# Patient Record
Sex: Male | Born: 1965 | Race: White | Hispanic: No | Marital: Single | Smoking: Former smoker
Health system: Southern US, Community
[De-identification: ages and names within clinical notes are randomized; demographics above are authoritative.]

## PROBLEM LIST (undated history)

## (undated) DIAGNOSIS — B009 Herpesviral infection, unspecified: Secondary | ICD-10-CM

## (undated) DIAGNOSIS — K5792 Diverticulitis of intestine, part unspecified, without perforation or abscess without bleeding: Secondary | ICD-10-CM

## (undated) HISTORY — PX: VASECTOMY: SHX75

## (undated) HISTORY — PX: WISDOM TOOTH EXTRACTION: SHX21

---

## 2015-05-05 ENCOUNTER — Emergency Department (HOSPITAL_COMMUNITY)
Admission: EM | Admit: 2015-05-05 | Discharge: 2015-05-05 | Disposition: A | Discharge status: Home / Self Care | Payer: BLUE CROSS/BLUE SHIELD | Attending: Emergency Medicine | Admitting: Emergency Medicine

## 2015-05-05 ENCOUNTER — Encounter (HOSPITAL_COMMUNITY): Payer: Self-pay | Admitting: *Deleted

## 2015-05-05 ENCOUNTER — Emergency Department (HOSPITAL_COMMUNITY): Payer: BLUE CROSS/BLUE SHIELD

## 2015-05-05 DIAGNOSIS — R002 Palpitations: Secondary | ICD-10-CM | POA: Diagnosis not present

## 2015-05-05 DIAGNOSIS — F17200 Nicotine dependence, unspecified, uncomplicated: Secondary | ICD-10-CM | POA: Diagnosis not present

## 2015-05-05 DIAGNOSIS — R079 Chest pain, unspecified: Secondary | ICD-10-CM | POA: Diagnosis present

## 2015-05-05 DIAGNOSIS — R0781 Pleurodynia: Secondary | ICD-10-CM | POA: Diagnosis not present

## 2015-05-05 DIAGNOSIS — R5383 Other fatigue: Secondary | ICD-10-CM | POA: Diagnosis not present

## 2015-05-05 LAB — CBC
HCT: 42.6 % (ref 39.0–52.0)
HEMOGLOBIN: 15.1 g/dL (ref 13.0–17.0)
MCH: 31.5 pg (ref 26.0–34.0)
MCHC: 35.4 g/dL (ref 30.0–36.0)
MCV: 88.8 fL (ref 78.0–100.0)
PLATELETS: 200 10*3/uL (ref 150–400)
RBC: 4.8 MIL/uL (ref 4.22–5.81)
RDW: 12 % (ref 11.5–15.5)
WBC: 7 10*3/uL (ref 4.0–10.5)

## 2015-05-05 LAB — BASIC METABOLIC PANEL
Anion gap: 8 (ref 5–15)
BUN: 16 mg/dL (ref 6–20)
CHLORIDE: 106 mmol/L (ref 101–111)
CO2: 26 mmol/L (ref 22–32)
CREATININE: 0.94 mg/dL (ref 0.61–1.24)
Calcium: 9.5 mg/dL (ref 8.9–10.3)
GFR calc non Af Amer: >60 mL/min (ref >60)
GLUCOSE: 67 mg/dL (ref 65–99)
Potassium: 3.9 mmol/L (ref 3.5–5.1)
Sodium: 140 mmol/L (ref 135–145)

## 2015-05-05 LAB — I-STAT TROPONIN, ED
TROPONIN I, POC: 0 ng/mL (ref 0.00–0.08)
Troponin i, poc: 0 ng/mL (ref 0.00–0.08)

## 2015-05-05 LAB — LIPASE, BLOOD: Lipase: 34 U/L (ref 11–51)

## 2015-05-05 MED ORDER — NAPROXEN 500 MG PO TABS: 500.0000 mg | ORAL_TABLET | Freq: Two times a day (BID) | ORAL | Status: DC

## 2015-05-05 MED ORDER — METHOCARBAMOL 500 MG PO TABS: 500.0000 mg | ORAL_TABLET | Freq: Two times a day (BID) | ORAL | Status: DC | PRN

## 2015-05-05 NOTE — ED Provider Notes (Signed)
CSN: [id number]     Arrival date & time 05/05/15  1151 History   First MD Initiated Contact with Patient 05/05/15 1217     Chief Complaint  Patient presents with  . Chest Pain     (Consider location/radiation/quality/duration/timing/severity/associated sxs/prior Treatment) HPI Comments: Jerome Burke is a 50 year old male, current smoker, otherwise healthy presents to the emergency department with left lower chest and rib pain 1 day, which is described as sharp and uncomfortable, worsened with deep inspiration or with arching his back, relieved with rest, without any associated symptoms. His chest pain does not worsen with exertion. He denies cough, wheeze, shortness of breath, fever, chills, sweats.   He also concerned with palpitations described as "a flutter" that have been occurring more frequently and for longer over the past 3 weeks. He states that for several years he has had brief palpitations the last 1-2 seconds, and became much worse 3 weeks ago after drinking heavily one day and after recent URI.  Patient denies any associated diaphoresis, chest pain, shortness of breath, near syncope, nausea, vomiting. He denies any lower extremity edema or weight gain. He has mild fatigue which he believes is related to his recent URI. He denies any history of hypertension, hyperlipidemia, diabetes. He is otherwise healthy, exercises normally.  No family history of MI or sharp. He does have a brother with A. Fib.  Smoking history estimated to be 20-pack-year.  Patient is a 50 y.o. male presenting with chest pain and palpitations. The history is provided by the patient.  Chest Pain Pain location:  L lateral chest Pain quality: sharp   Pain radiates to:  Does not radiate Pain radiates to the back: no   Pain severity:  Mild Onset quality:  Sudden Duration:  1 day Timing:  Intermittent Progression:  Unchanged Chronicity:  New Context: breathing and movement   Context: no drug use, not  eating, no intercourse, not lifting, not raising an arm, not at rest, no stress and no trauma   Relieved by:  Rest Worsened by:  Deep breathing Associated symptoms: fatigue and palpitations   Associated symptoms: no abdominal pain, no altered mental status, no anxiety, no back pain, no claudication, no cough, no diaphoresis, no dizziness, no fever, no headache, no heartburn, no lower extremity edema, no nausea, no near-syncope, no numbness, no orthopnea, no PND, no shortness of breath, no syncope, not vomiting and no weakness   Associated symptoms comment:  Palpitation lasting up to 3 hours for about 3 weeks URI 2 weeks ago Fatigue:    Severity:  Mild   Duration:  2 weeks   Timing:  Intermittent (after recent URI 2 weeks ago)   Progression:  Improving Risk factors: smoking (35 years smoking hx)   Risk factors: no diabetes mellitus, no high cholesterol, no hypertension and not obese   Palpitations Palpitations quality:  Irregular Onset quality:  Sudden Duration:  3 hours Timing:  Intermittent Progression:  Worsening Chronicity:  Recurrent Context: nicotine   Context: not anxiety, not appetite suppressants, not blood loss, not bronchodilators, not caffeine, not dehydration, not exercise, not hyperventilation, not illicit drugs and not stimulant use   Relieved by:  Nothing Worsened by:  Nothing Ineffective treatments:  None tried Associated symptoms: chest pain   Associated symptoms: no back pain, no chest pressure, no cough, no diaphoresis, no dizziness, no hemoptysis, no leg pain, no lower extremity edema, no nausea, no near-syncope, no numbness, no orthopnea, no PND, no shortness of breath, no syncope, no vomiting  and no weakness     History reviewed. No pertinent past medical history. History reviewed. No pertinent past surgical history. No family history on file. Social History  Substance Use Topics  . Smoking status: Current Every Day Smoker  . Smokeless tobacco: None  .  Alcohol Use: Yes     Comment: occ    Review of Systems  Constitutional: Positive for fatigue. Negative for fever and diaphoresis.  Respiratory: Negative for cough, hemoptysis and shortness of breath.   Cardiovascular: Positive for chest pain and palpitations. Negative for orthopnea, claudication, syncope, PND and near-syncope.  Gastrointestinal: Negative for heartburn, nausea, vomiting and abdominal pain.  Musculoskeletal: Negative for back pain.  Neurological: Negative for dizziness, weakness, numbness and headaches.  All other systems reviewed and are negative.     Allergies  Review of patient's allergies indicates no known allergies.  Home Medications   Prior to Admission medications   Medication Sig Start Date End Date Taking? Authorizing Provider  ibuprofen (ADVIL,MOTRIN) 200 MG tablet Take 400 mg by mouth every 6 (six) hours as needed for mild pain.   Yes Historical Provider, MD  methocarbamol (ROBAXIN) 500 MG tablet Take 1 tablet (500 mg total) by mouth 2 (two) times daily as needed for muscle spasms. 05/05/15   Danelle Berry, PA-C  naproxen (NAPROSYN) 500 MG tablet Take 1 tablet (500 mg total) by mouth 2 (two) times daily with a meal. 05/05/15   Danelle Berry, PA-C  simethicone (MYLICON) 80 MG chewable tablet Chew 80 mg by mouth every 6 (six) hours as needed for flatulence.   Yes Historical Provider, MD   BP 112/76 mmHg  Pulse 62  Temp(Src) 98 F (36.7 C) (Oral)  Resp 16  SpO2 99% Physical Exam  Constitutional: He is oriented to person, place, and time. He appears well-developed and well-nourished. No distress.  HENT:  Head: Normocephalic and atraumatic.  Nose: Nose normal.  Mouth/Throat: Oropharynx is clear and moist. No oropharyngeal exudate.  Eyes: Conjunctivae and EOM are normal. Pupils are equal, round, and reactive to light. Right eye exhibits no discharge. Left eye exhibits no discharge. No scleral icterus.  Neck: Normal range of motion. No JVD present. No tracheal  deviation present. No thyromegaly present.  Cardiovascular: Normal rate, regular rhythm, normal heart sounds and intact distal pulses.   Occasional extrasystoles are present. Exam reveals no gallop and no friction rub.   No murmur heard. Pulses:      Radial pulses are 2+ on the right side, and 2+ on the left side.       Dorsalis pedis pulses are 2+ on the right side, and 2+ on the left side.  No LE edema  Pulmonary/Chest: Effort normal and breath sounds normal. No respiratory distress. He has no wheezes. He has no rales. He exhibits no tenderness.  Abdominal: Soft. Bowel sounds are normal. He exhibits no distension and no mass. There is no tenderness. There is no rebound and no guarding.  Musculoskeletal: Normal range of motion. He exhibits no edema or tenderness.  Lymphadenopathy:    He has no cervical adenopathy.  Neurological: He is alert and oriented to person, place, and time. He has normal reflexes. No cranial nerve deficit. He exhibits normal muscle tone. Coordination normal.  Skin: Skin is warm and dry. No rash noted. He is not diaphoretic. No erythema. No pallor.  Psychiatric: He has a normal mood and affect. His behavior is normal. Judgment and thought content normal.  Nursing note and vitals reviewed.   ED  Course  Procedures (including critical care time) Labs Review Labs Reviewed  BASIC METABOLIC PANEL  CBC  LIPASE, BLOOD  I-STAT TROPOININ, ED    Imaging Review Dg Chest 2 View  05/05/2015  CLINICAL DATA:  50 year old male with heart palpitations and chest pressure EXAM: CHEST  2 VIEW COMPARISON:  None. FINDINGS: The lungs are clear and negative for focal airspace consolidation, pulmonary edema or suspicious pulmonary nodule. No pleural effusion or pneumothorax. Cardiac and mediastinal contours are within normal limits. No acute fracture or lytic or blastic osseous lesions. The visualized upper abdominal bowel gas pattern is unremarkable. IMPRESSION: No active cardiopulmonary  disease. Electronically Signed   By: Malachy Moan M.D.   On: 05/05/2015 13:15   I have personally reviewed and evaluated these images and lab results as part of my medical decision-making.   EKG Interpretation   Date/Time:  Saturday May 05 2015 11:56:18 EDT Ventricular Rate:  68 PR Interval:  146 QRS Duration: 88 QT Interval:  366 QTC Calculation: 389 R Axis:   87 Text Interpretation:  Normal sinus rhythm Right atrial enlargement Septal  infarct , age undetermined Abnormal ECG Confirmed by RAY MD, Duwayne Heck  [phone]) on 05/05/2015 2:07:40 PM      MDM   50 y/o male, current smoker, otherwise healthy, presents to the ER with left lower rib and chest pain, onset one day ago, pt believes he "slept wrong."  At rest patient has no chest pain.  Chest pain is not in increased or brought on by exertion, no occurrence at rest, not concerning for ACS.  Chest pain often obtaining for PE, PERC negative.  Also troponin will be obtained at 5 PM, expect patient to be a disposition at that time with a negative delta troponin.   Patient has been observed on monitoring in the ER for several hours, he has occasional PVCs without any other associated symptoms other than feeling the palpitations.  Suggest cardiology follow up, referral given on d/c paperwork.  Delta trop pending, hand off given to [employer], PA-C at shift change.  Will D/C with negative trop.   Final diagnoses:  Palpitations  Pleuritic chest pain       Danelle Berry, PA-C 05/09/15 1012  Margarita Grizzle, MD 05/14/15 1245

## 2015-05-05 NOTE — Discharge Instructions (Signed)
Palpitations A palpitation is the feeling that your heartbeat is irregular or is faster than normal. It may feel like your heart is fluttering or skipping a beat. Palpitations are usually not a serious problem. However, in some cases, you may need further medical evaluation. CAUSES  Palpitations can be caused by:  Smoking.  Caffeine or other stimulants, such as diet pills or energy drinks.  Alcohol.  Stress and anxiety.  Strenuous physical activity.  Fatigue.  Certain medicines.  Heart disease, especially if you have a history of irregular heart rhythms (arrhythmias), such as atrial fibrillation, atrial flutter, or supraventricular tachycardia.  An improperly working pacemaker or defibrillator. DIAGNOSIS  To find the cause of your palpitations, your health care provider will take your medical history and perform a physical exam. Your health care provider may also have you take a test called an ambulatory electrocardiogram (ECG). An ECG records your heartbeat patterns over a 24-hour period. You may also have other tests, such as:  Transthoracic echocardiogram (TTE). During echocardiography, sound waves are used to evaluate how blood flows through your heart.  Transesophageal echocardiogram (TEE).  Cardiac monitoring. This allows your health care provider to monitor your heart rate and rhythm in real time.  Holter monitor. This is a portable device that records your heartbeat and can help diagnose heart arrhythmias. It allows your health care provider to track your heart activity for several days, if needed.  Stress tests by exercise or by giving medicine that makes the heart beat faster. TREATMENT  Treatment of palpitations depends on the cause of your symptoms and can vary greatly. Most cases of palpitations do not require any treatment other than time, relaxation, and monitoring your symptoms. Other causes, such as atrial fibrillation, atrial flutter, or supraventricular  tachycardia, usually require further treatment. HOME CARE INSTRUCTIONS   Avoid:  Caffeinated coffee, tea, soft drinks, diet pills, and energy drinks.  Chocolate.  Alcohol.  Stop smoking if you smoke.  Reduce your stress and anxiety. Things that can help you relax include:  A method of controlling things in your body, such as your heartbeats, with your mind (biofeedback).  Yoga.  Meditation.  Physical activity such as swimming, jogging, or walking.  Get plenty of rest and sleep. SEEK MEDICAL CARE IF:   You continue to have a fast or irregular heartbeat beyond 24 hours.  Your palpitations occur more often. SEEK IMMEDIATE MEDICAL CARE IF:  You have chest pain or shortness of breath.  You have a severe headache.  You feel dizzy or you faint. MAKE SURE YOU:  Understand these instructions.  Will watch your condition.  Will get help right away if you are not doing well or get worse.   This information is not intended to replace advice given to you by your health care provider. Make sure you discuss any questions you have with your health care provider.   Document Released: 01/04/2000 Document Revised: 01/11/2013 Document Reviewed: 03/07/2011 Elsevier Interactive Patient Education 2016 [employer]     Chest Wall Pain Chest wall pain is pain in or around the bones and muscles of your chest. Sometimes, an injury causes this pain. Sometimes, the cause may not be known. This pain may take several weeks or longer to get better. HOME CARE INSTRUCTIONS  Pay attention to any changes in your symptoms. Take these actions to help with your pain:   Rest as told by your health care provider.   Avoid activities that cause pain. These include any activities that use  your chest muscles or your abdominal and side muscles to lift heavy items.   If directed, apply ice to the painful area:  Put ice in a plastic bag.  Place a towel between your skin and the bag.  Leave the  ice on for 20 minutes, 2-3 times per day.  Take over-the-counter and prescription medicines only as told by your health care provider.  Do not use tobacco products, including cigarettes, chewing tobacco, and e-cigarettes. If you need help quitting, ask your health care provider.  Keep all follow-up visits as told by your health care provider. This is important. SEEK MEDICAL CARE IF:  You have a fever.  Your chest pain becomes worse.  You have new symptoms. SEEK IMMEDIATE MEDICAL CARE IF:  You have nausea or vomiting.  You feel sweaty or light-headed.  You have a cough with phlegm (sputum) or you cough up blood.  You develop shortness of breath.   This information is not intended to replace advice given to you by your health care provider. Make sure you discuss any questions you have with your health care provider.   Document Released: 01/06/2005 Document Revised: 09/27/2014 Document Reviewed: 04/03/2014 Elsevier Interactive Patient Education [employer]

## 2015-05-05 NOTE — ED Notes (Signed)
Pt ambulated without any assistance. Pt requested to jog in place in room. Pt jogged for 3 minutes. Initial HR was 88 and ending was 98. Pt placed back on monitor and NSR was seen with occasional PVCs. Pt denied any dizziness, SOB etc.

## 2015-05-05 NOTE — ED Provider Notes (Signed)
Patient signed out to me by Angelica Chessmanapia, PA-C.  Intermittent CP x 3 weeks.  No SOB.  Plan: DC pending normal delta trop.  Second troponin is negative. Symptoms seem musculoskeletal, the pain is reproducible with palpation and with movement. Doubt ACS. Recommend the/cardiology follow-up. Patient understands agrees the plan. He is stable and ready for discharge.  Roxy Horsemanobert Aron Inge, PA-C 05/05/15 1830  Margarita Grizzleanielle Ray, MD 05/08/15 1620

## 2015-05-05 NOTE — ED Notes (Signed)
Pt is here with left below chest/upper abdominal pain intermittent over the last three weeks. No sob, vomiting

## 2017-03-28 IMAGING — DX DG CHEST 2V
2 series · 2 of 2 positions shown · non-contrast
Comparison: None.

CLINICAL DATA: 50-year-old male with heart palpitations and chest
pressure

EXAM:
CHEST  2 VIEW

[chest pa]
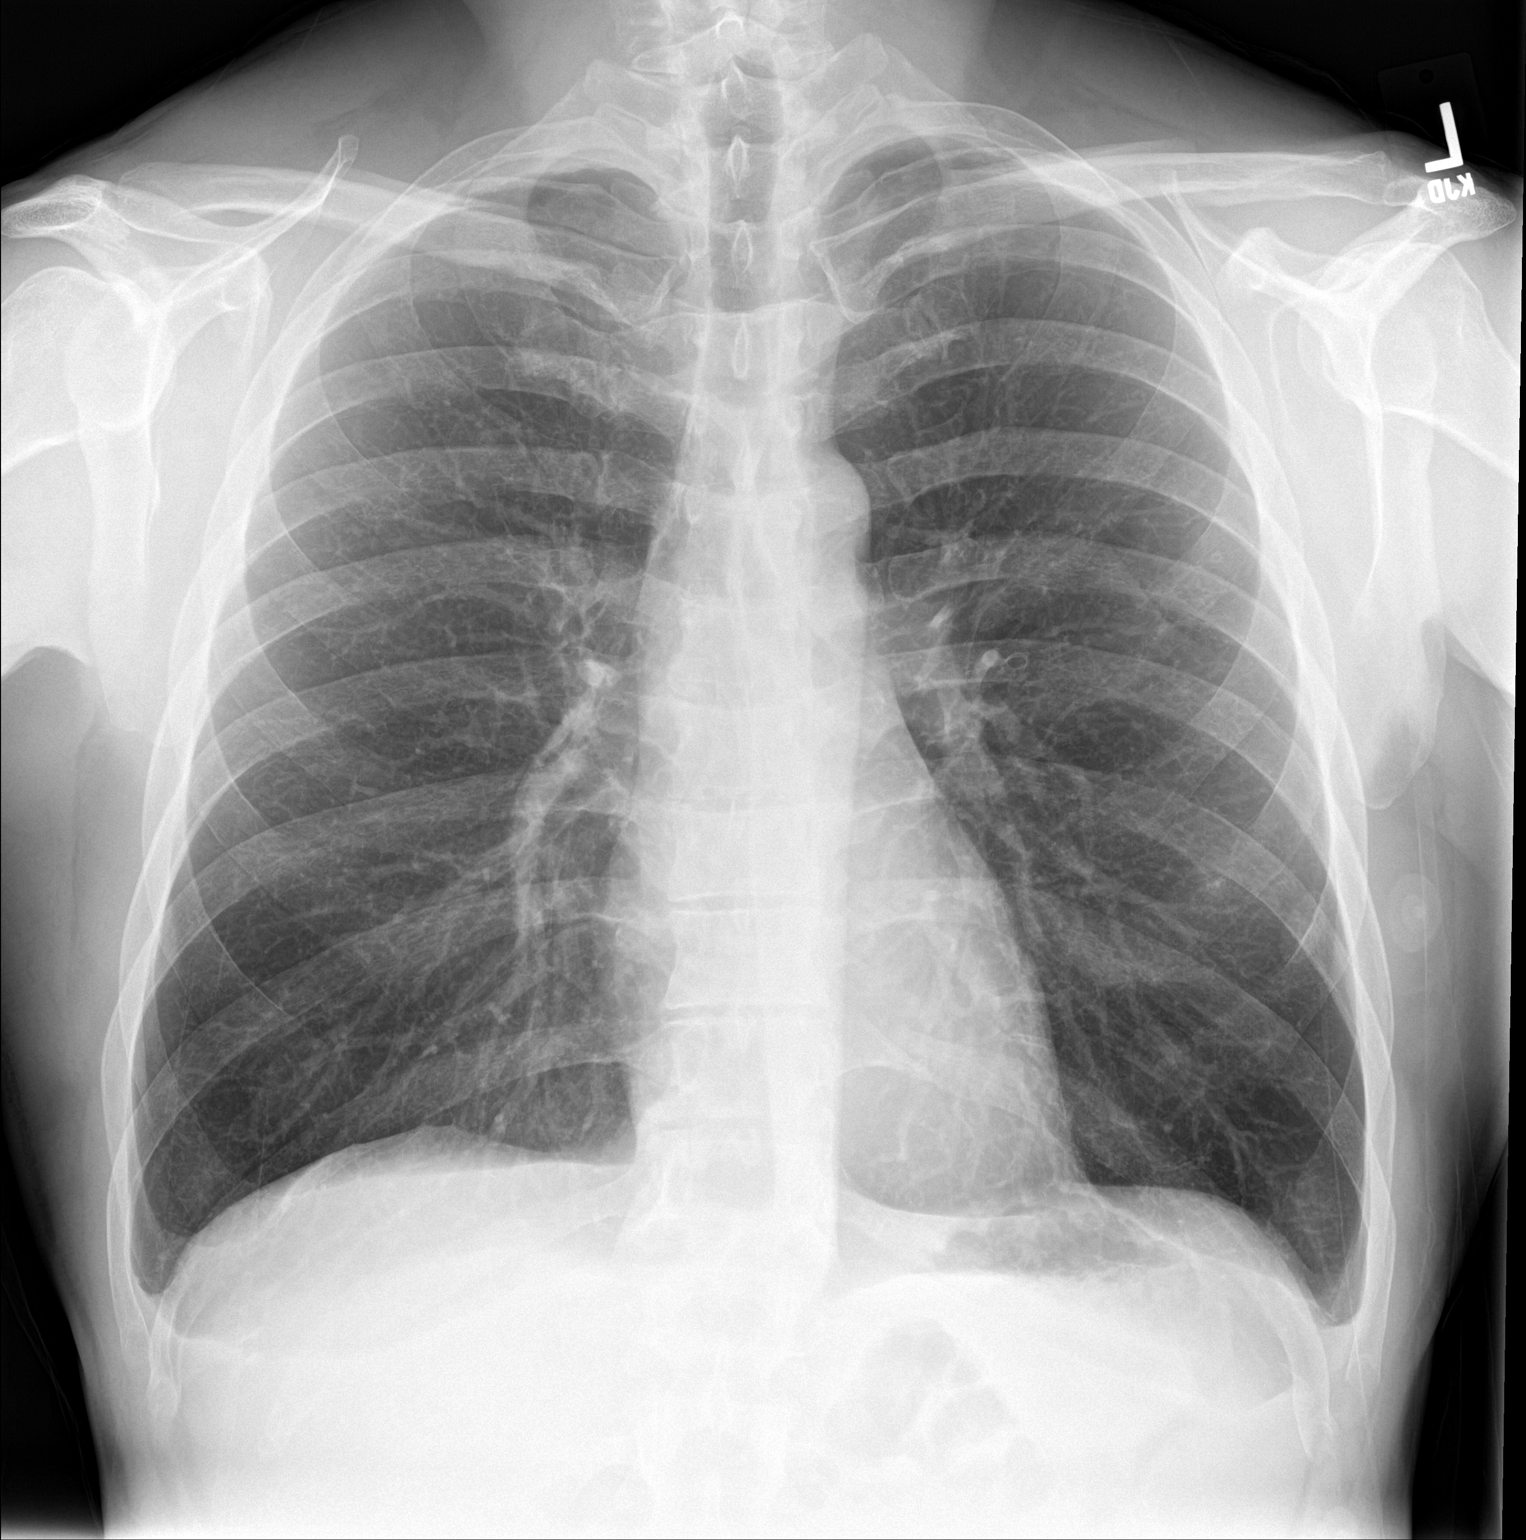

[chest lat]
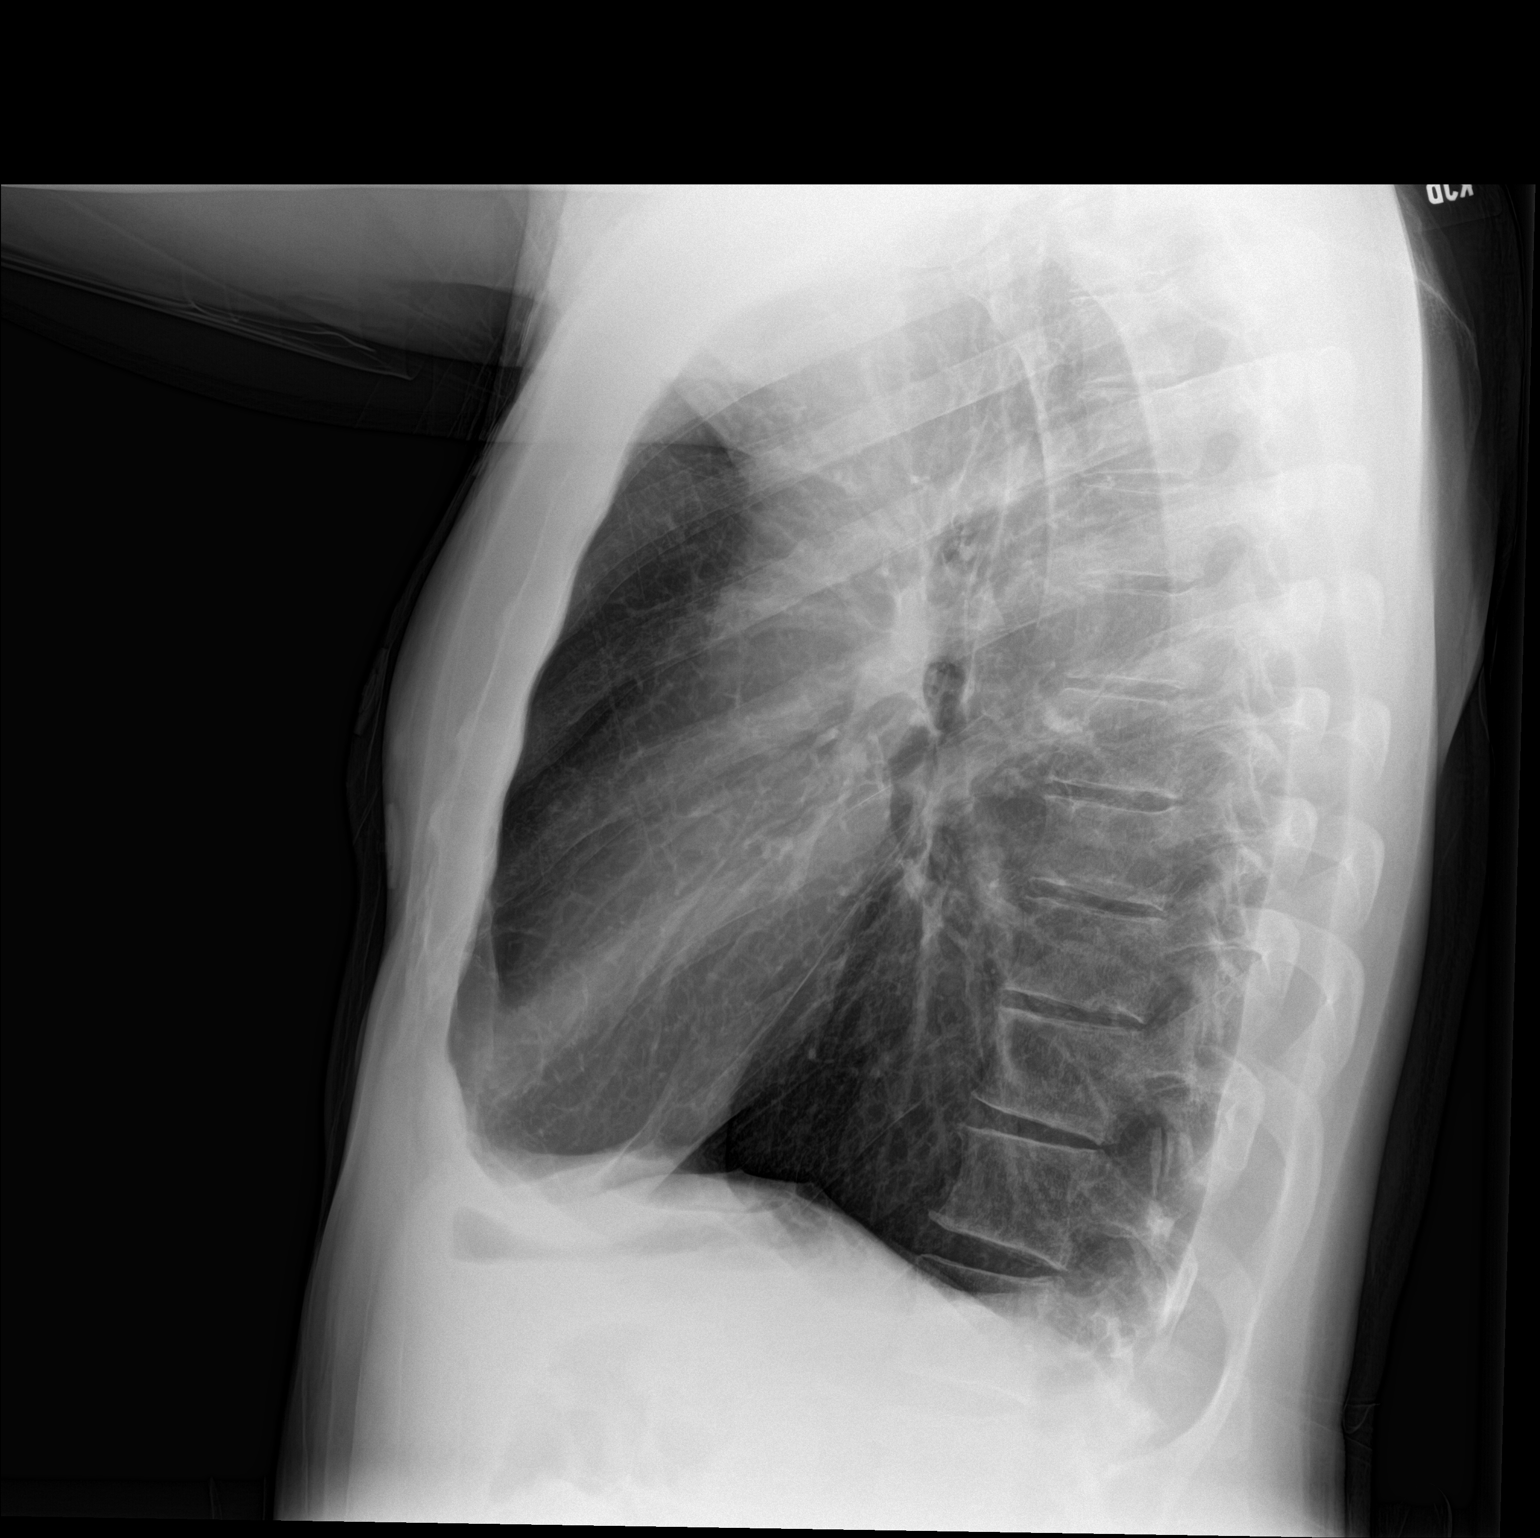

[2 of 2 positions shown; findings below may reference images not displayed]

FINDINGS: The lungs are clear and negative for focal airspace consolidation,
pulmonary edema or suspicious pulmonary nodule. No pleural effusion
or pneumothorax. Cardiac and mediastinal contours are within normal
limits. No acute fracture or lytic or blastic osseous lesions. The
visualized upper abdominal bowel gas pattern is unremarkable.
IMPRESSION: No active cardiopulmonary disease.

## 2018-06-02 ENCOUNTER — Other Ambulatory Visit: Payer: Self-pay

## 2018-06-02 ENCOUNTER — Emergency Department (HOSPITAL_COMMUNITY): Payer: BLUE CROSS/BLUE SHIELD

## 2018-06-02 ENCOUNTER — Inpatient Hospital Stay (HOSPITAL_COMMUNITY)
Admission: EM | Admit: 2018-06-02 | Discharge: 2018-06-04 | DRG: 392 | Disposition: A | Discharge status: Home / Self Care | Payer: BLUE CROSS/BLUE SHIELD | Attending: Family Medicine | Admitting: Family Medicine

## 2018-06-02 ENCOUNTER — Encounter (HOSPITAL_COMMUNITY): Payer: Self-pay

## 2018-06-02 DIAGNOSIS — M2550 Pain in unspecified joint: Secondary | ICD-10-CM

## 2018-06-02 DIAGNOSIS — R21 Rash and other nonspecific skin eruption: Secondary | ICD-10-CM | POA: Diagnosis present

## 2018-06-02 DIAGNOSIS — M4316 Spondylolisthesis, lumbar region: Secondary | ICD-10-CM | POA: Diagnosis present

## 2018-06-02 DIAGNOSIS — R1030 Lower abdominal pain, unspecified: Secondary | ICD-10-CM | POA: Diagnosis present

## 2018-06-02 DIAGNOSIS — R6884 Jaw pain: Secondary | ICD-10-CM | POA: Diagnosis present

## 2018-06-02 DIAGNOSIS — I700 Atherosclerosis of aorta: Secondary | ICD-10-CM | POA: Diagnosis present

## 2018-06-02 DIAGNOSIS — F17290 Nicotine dependence, other tobacco product, uncomplicated: Secondary | ICD-10-CM | POA: Diagnosis present

## 2018-06-02 DIAGNOSIS — A6000 Herpesviral infection of urogenital system, unspecified: Secondary | ICD-10-CM | POA: Diagnosis present

## 2018-06-02 DIAGNOSIS — K5700 Diverticulitis of small intestine with perforation and abscess without bleeding: Secondary | ICD-10-CM

## 2018-06-02 DIAGNOSIS — Z20828 Contact with and (suspected) exposure to other viral communicable diseases: Secondary | ICD-10-CM | POA: Diagnosis present

## 2018-06-02 DIAGNOSIS — K5720 Diverticulitis of large intestine with perforation and abscess without bleeding: Secondary | ICD-10-CM | POA: Diagnosis present

## 2018-06-02 DIAGNOSIS — R161 Splenomegaly, not elsewhere classified: Secondary | ICD-10-CM | POA: Diagnosis present

## 2018-06-02 HISTORY — DX: Herpesviral infection, unspecified: B00.9

## 2018-06-02 LAB — URINALYSIS, MICROSCOPIC (REFLEX)

## 2018-06-02 LAB — CBC WITH DIFFERENTIAL/PLATELET
Abs Immature Granulocytes: 0.19 10*3/uL — ABNORMAL HIGH (ref 0.00–0.07)
Basophils Absolute: 0 10*3/uL (ref 0.0–0.1)
Basophils Relative: 1 %
Eosinophils Absolute: 0.1 10*3/uL (ref 0.0–0.5)
Eosinophils Relative: 1 %
HCT: 42.7 % (ref 39.0–52.0)
Hemoglobin: 14.9 g/dL (ref 13.0–17.0)
Immature Granulocytes: 2 %
Lymphocytes Relative: 14 %
Lymphs Abs: 1.2 10*3/uL (ref 0.7–4.0)
MCH: 31.6 pg (ref 26.0–34.0)
MCHC: 34.9 g/dL (ref 30.0–36.0)
MCV: 90.5 fL (ref 80.0–100.0)
Monocytes Absolute: 0.5 10*3/uL (ref 0.1–1.0)
Monocytes Relative: 5 %
Neutro Abs: 6.6 10*3/uL (ref 1.7–7.7)
Neutrophils Relative %: 77 %
Platelets: 208 10*3/uL (ref 150–400)
RBC: 4.72 MIL/uL (ref 4.22–5.81)
RDW: 11.9 % (ref 11.5–15.5)
WBC: 8.6 10*3/uL (ref 4.0–10.5)
nRBC: 0 % (ref 0.0–0.2)

## 2018-06-02 LAB — URINALYSIS, ROUTINE W REFLEX MICROSCOPIC
Bilirubin Urine: NEGATIVE
Glucose, UA: NEGATIVE mg/dL
Ketones, ur: NEGATIVE mg/dL
Leukocytes,Ua: NEGATIVE
Nitrite: NEGATIVE
Protein, ur: NEGATIVE mg/dL
Specific Gravity, Urine: <1.005 — ABNORMAL LOW (ref 1.005–1.030)
pH: 6.5 (ref 5.0–8.0)

## 2018-06-02 LAB — COMPREHENSIVE METABOLIC PANEL
ALT: 16 U/L (ref 0–44)
AST: 18 U/L (ref 15–41)
Albumin: 3.2 g/dL — ABNORMAL LOW (ref 3.5–5.0)
Alkaline Phosphatase: 52 U/L (ref 38–126)
Anion gap: 11 (ref 5–15)
BUN: 13 mg/dL (ref 6–20)
CO2: 23 mmol/L (ref 22–32)
Calcium: 9 mg/dL (ref 8.9–10.3)
Chloride: 100 mmol/L (ref 98–111)
Creatinine, Ser: 1.06 mg/dL (ref 0.61–1.24)
GFR calc Af Amer: >60 mL/min (ref >60)
GFR calc non Af Amer: >60 mL/min (ref >60)
Glucose, Bld: 94 mg/dL (ref 70–99)
Potassium: 3.6 mmol/L (ref 3.5–5.1)
Sodium: 134 mmol/L — ABNORMAL LOW (ref 135–145)
Total Bilirubin: 1.1 mg/dL (ref 0.3–1.2)
Total Protein: 6.4 g/dL — ABNORMAL LOW (ref 6.5–8.1)

## 2018-06-02 LAB — C-REACTIVE PROTEIN: CRP: 16.4 mg/dL — ABNORMAL HIGH (ref <1.0)

## 2018-06-02 LAB — SEDIMENTATION RATE: Sed Rate: 35 mm/hr — ABNORMAL HIGH (ref 0–16)

## 2018-06-02 LAB — SARS CORONAVIRUS 2 BY RT PCR (HOSPITAL ORDER, PERFORMED IN ~~LOC~~ HOSPITAL LAB): SARS Coronavirus 2: NEGATIVE

## 2018-06-02 LAB — LIPASE, BLOOD: Lipase: 37 U/L (ref 11–51)

## 2018-06-02 MED ORDER — IOHEXOL 300 MG/ML  SOLN
100.0000 mL | Freq: Once | INTRAMUSCULAR | Status: AC | PRN
  Administered 2018-06-02: 13:00:00 100 mL via INTRAVENOUS

## 2018-06-02 MED ORDER — DIPHENHYDRAMINE HCL 50 MG/ML IJ SOLN: 50.0000 mg | Freq: Once | INTRAMUSCULAR | Status: DC

## 2018-06-02 MED ORDER — MORPHINE SULFATE (PF) 2 MG/ML IV SOLN
2.0000 mg | INTRAVENOUS | Status: DC | PRN
  Administered 2018-06-02: 2 mg via INTRAVENOUS
  Filled 2018-06-02: qty 1

## 2018-06-02 MED ORDER — SODIUM CHLORIDE 0.9 % IV SOLN
INTRAVENOUS | Status: DC
  Administered 2018-06-02: 16:00:00 via INTRAVENOUS
  Filled 2018-06-02 (×6): qty 1000

## 2018-06-02 MED ORDER — ONDANSETRON HCL 4 MG PO TABS: 4.0000 mg | ORAL_TABLET | Freq: Four times a day (QID) | ORAL | Status: DC | PRN

## 2018-06-02 MED ORDER — PIPERACILLIN-TAZOBACTAM 3.375 G IVPB
3.3750 g | Freq: Three times a day (TID) | INTRAVENOUS | Status: DC
  Administered 2018-06-02 – 2018-06-04 (×6): 3.375 g via INTRAVENOUS
  Filled 2018-06-02 (×6): qty 50

## 2018-06-02 MED ORDER — DIPHENHYDRAMINE HCL 50 MG/ML IJ SOLN
12.5000 mg | Freq: Four times a day (QID) | INTRAMUSCULAR | Status: DC | PRN
  Administered 2018-06-03: 17:00:00 12.5 mg via INTRAVENOUS
  Filled 2018-06-02: qty 1

## 2018-06-02 MED ORDER — FAMOTIDINE IN NACL 20-0.9 MG/50ML-% IV SOLN
20.0000 mg | Freq: Once | INTRAVENOUS | Status: AC
  Administered 2018-06-02: 20 mg via INTRAVENOUS
  Filled 2018-06-02: qty 50

## 2018-06-02 MED ORDER — SODIUM CHLORIDE 0.9 % IV BOLUS
1000.0000 mL | Freq: Once | INTRAVENOUS | Status: AC
  Administered 2018-06-02: 13:00:00 1000 mL via INTRAVENOUS

## 2018-06-02 MED ORDER — KETOROLAC TROMETHAMINE 15 MG/ML IJ SOLN
15.0000 mg | Freq: Once | INTRAMUSCULAR | Status: AC
  Administered 2018-06-02: 15 mg via INTRAVENOUS
  Filled 2018-06-02: qty 1

## 2018-06-02 MED ORDER — ACETAMINOPHEN 325 MG PO TABS
650.0000 mg | ORAL_TABLET | Freq: Four times a day (QID) | ORAL | Status: DC | PRN
  Administered 2018-06-03: 650 mg via ORAL
  Filled 2018-06-02: qty 2

## 2018-06-02 MED ORDER — ONDANSETRON HCL 4 MG/2ML IJ SOLN: 4.0000 mg | Freq: Four times a day (QID) | INTRAMUSCULAR | Status: DC | PRN

## 2018-06-02 MED ORDER — ACETAMINOPHEN 650 MG RE SUPP: 650.0000 mg | Freq: Four times a day (QID) | RECTAL | Status: DC | PRN

## 2018-06-02 NOTE — ED Triage Notes (Signed)
Pt presents to ED with generalized hives and joint pain. Pt states this started Saturday with lower GI pain and rectum pain. Pt states this has happened twice before and at previous visit discussion on the possibility of shellfish allergy took place. Pt denies eating any shellfish. Pt states he took 2 benedryl tablets but is unsure of dose.

## 2018-06-02 NOTE — Plan of Care (Signed)

## 2018-06-02 NOTE — ED Notes (Signed)
Patient transported to CT 

## 2018-06-02 NOTE — ED Notes (Signed)
ED TO INPATIENT HANDOFF REPORT  ED Nurse Name and Phone #: Starr Sinclair Name/Age/Gender Jerome Burke 53 y.o. male Room/Bed: 034C/034C  Code Status   Code Status: Full Code  Home/SNF/Other Home Patient oriented to: self, place, time and situation Is this baseline? Yes   Triage Complete: Triage complete  Chief Complaint Hives and joint pain  Triage Note Pt presents to ED with generalized hives and joint pain. Pt states this started Saturday with lower GI pain and rectum pain. Pt states this has happened twice before and at previous visit discussion on the possibility of shellfish allergy took place. Pt denies eating any shellfish. Pt states he took 2 benedryl tablets but is unsure of dose.  Pt in with abdominal pain and hives x 5 days. States hives worse today, reports intermittent fevers   Allergies No Known Allergies  Level of Care/Admitting Diagnosis ED Disposition    ED Disposition Condition Comment   Admit  Hospital Area: [facility] [[id number]]  Level of Care: Med-Surg [16]  Covid Evaluation: Screening Protocol (No Symptoms)  Diagnosis: Abscess of sigmoid colon due to diverticulitis [[id number]]  Admitting Physician: Jonah Blue [[id number]]  Attending Physician: Jonah Blue [[id number]]  Estimated length of stay: 3 - 4 days  Certification:: I certify this patient will need inpatient services for at least 2 midnights  PT Class (Do Not Modify): Inpatient [101]  PT Acc Code (Do Not Modify): Private [1]       B Medical/Surgery History Past Medical History:  Diagnosis Date  . HSV-1 (herpes simplex virus 1) infection    Past Surgical History:  Procedure Laterality Date  . VASECTOMY       A IV Location/Drains/Wounds Patient Lines/Drains/Airways Status   Active Line/Drains/Airways    Name:   Placement date:   Placement time:   Site:   Days:   Peripheral IV 06/02/18 Left Antecubital   06/02/18    1006    Antecubital   less than 1    Peripheral IV 06/02/18 Left Forearm   06/02/18    1458    Forearm   less than 1          Intake/Output Last 24 hours  Intake/Output Summary (Last 24 hours) at 06/02/2018 1615 Last data filed at 06/02/2018 1439 Gross per 24 hour  Intake 1000 ml  Output -  Net 1000 ml    Labs/Imaging Results for orders placed or performed during the hospital encounter of 06/02/18 (from the past 48 hour(s))  CBC with Differential     Status: Abnormal   Collection Time: 06/02/18  9:52 AM  Result Value Ref Range   WBC 8.6 4.0 - 10.5 K/uL   RBC 4.72 4.22 - 5.81 MIL/uL   Hemoglobin 14.9 13.0 - 17.0 g/dL   HCT 19.1 47.8 - 29.5 %   MCV 90.5 80.0 - 100.0 fL   MCH 31.6 26.0 - 34.0 pg   MCHC 34.9 30.0 - 36.0 g/dL   RDW 62.1 30.8 - 65.7 %   Platelets 208 150 - 400 K/uL   nRBC 0.0 0.0 - 0.2 %   Neutrophils Relative % 77 %   Neutro Abs 6.6 1.7 - 7.7 K/uL   Lymphocytes Relative 14 %   Lymphs Abs 1.2 0.7 - 4.0 K/uL   Monocytes Relative 5 %   Monocytes Absolute 0.5 0.1 - 1.0 K/uL   Eosinophils Relative 1 %   Eosinophils Absolute 0.1 0.0 - 0.5 K/uL   Basophils Relative 1 %  Basophils Absolute 0.0 0.0 - 0.1 K/uL   Immature Granulocytes 2 %   Abs Immature Granulocytes 0.19 (H) 0.00 - 0.07 K/uL    Comment: Performed at [facility], [address] [place], [place] [address]  Comprehensive metabolic panel     Status: Abnormal   Collection Time: 06/02/18  9:52 AM  Result Value Ref Range   Sodium 134 (L) 135 - 145 mmol/L   Potassium 3.6 3.5 - 5.1 mmol/L   Chloride 100 98 - 111 mmol/L   CO2 23 22 - 32 mmol/L   Glucose, Bld 94 70 - 99 mg/dL   BUN 13 6 - 20 mg/dL   Creatinine, Ser 6.04 0.61 - 1.24 mg/dL   Calcium 9.0 8.9 - 54.0 mg/dL   Total Protein 6.4 (L) 6.5 - 8.1 g/dL   Albumin 3.2 (L) 3.5 - 5.0 g/dL   AST 18 15 - 41 U/L   ALT 16 0 - 44 U/L   Alkaline Phosphatase 52 38 - 126 U/L   Total Bilirubin 1.1 0.3 - 1.2 mg/dL   GFR calc non Af Amer >60 >60 mL/min   GFR calc Af Amer >60 >60 mL/min    Anion gap 11 5 - 15    Comment: Performed at [facility], [address] [place], [place] [address]  Lipase, blood     Status: None   Collection Time: 06/02/18  9:52 AM  Result Value Ref Range   Lipase 37 11 - 51 U/L    Comment: Performed at [facility], [address], [place], [place] [address]  Sedimentation rate     Status: Abnormal   Collection Time: 06/02/18  9:52 AM  Result Value Ref Range   Sed Rate 35 (H) 0 - 16 mm/hr    Comment: Performed at [facility], [address], [place], [place] [address]  C-reactive protein     Status: Abnormal   Collection Time: 06/02/18 10:22 AM  Result Value Ref Range   CRP 16.4 (H) <1.0 mg/dL    Comment: Performed at [facility], [address] [place], [place] [address]  Urinalysis, Routine w reflex microscopic     Status: Abnormal   Collection Time: 06/02/18 10:34 AM  Result Value Ref Range   Color, Urine YELLOW YELLOW   APPearance CLEAR CLEAR   Specific Gravity, Urine <1.005 (L) 1.005 - 1.030   pH 6.5 5.0 - 8.0   Glucose, UA NEGATIVE NEGATIVE mg/dL   Hgb urine dipstick SMALL (A) NEGATIVE   Bilirubin Urine NEGATIVE NEGATIVE   Ketones, ur NEGATIVE NEGATIVE mg/dL   Protein, ur NEGATIVE NEGATIVE mg/dL   Nitrite NEGATIVE NEGATIVE   Leukocytes,Ua NEGATIVE NEGATIVE    Comment: Performed at [facility], [address] [place], [place] [address]  Urinalysis, Microscopic (reflex)     Status: Abnormal   Collection Time: 06/02/18 10:34 AM  Result Value Ref Range   RBC / HPF 0-5 0 - 5 RBC/hpf   WBC, UA 0-5 0 - 5 WBC/hpf   Bacteria, UA RARE (A) NONE SEEN   Squamous Epithelial / LPF 0-5 0 - 5    Comment: Performed at [facility], [address] [place], [place] [address]   Ct Abdomen Pelvis W Contrast  Result Date: 06/02/2018 CLINICAL DATA:  53 year old with lower abdominal pain EXAM: CT ABDOMEN AND PELVIS WITH CONTRAST TECHNIQUE: Multidetector CT imaging of the abdomen and pelvis was performed using  the  standard protocol following bolus administration of intravenous contrast. CONTRAST:  100mL OMNIPAQUE IOHEXOL 300 MG/ML  SOLN COMPARISON:  No prior CT FINDINGS: Lower chest: No acute finding of the lower chest Hepatobiliary: Unremarkable liver.  Unremarkable gallbladder. Pancreas: Unremarkable pancreas Spleen: Cranial caudal span of the spleen measures greater than 14 cm. Adrenals/Urinary Tract: Unremarkable appearance of the adrenal glands. No evidence of hydronephrosis of the right or left kidney. No nephrolithiasis. Unremarkable course of the bilateral ureters. Unremarkable appearance of the urinary bladder. Stomach/Bowel: Unremarkable stomach. Unremarkable small bowel. No abnormal distention. No transition point. Normal appendix. Mild to moderate stool burden. Inflammatory changes of the sigmoid colon in a region of diverticular change. Circumferential wall thickening. Edema of the fat of the mesocolon. There is a small fluid and gas collection measuring 2.4 cm. Thickening of the fascial planes. Vascular/Lymphatic: Mild atherosclerotic changes of the abdominal aorta. Bilateral iliac arteries and proximal femoral arteries are patent. No adenopathy. Reproductive: Minimal calcifications of the prostate. Other: None Musculoskeletal: No acute displaced fracture. Bilateral L5 pars defect with grade 1 anterolisthesis. IMPRESSION: Acute sigmoid diverticulitis complicated by micro perforation and a 2.4 cm abscess. Splenomegaly of uncertain etiology. Aortic Atherosclerosis (ICD10-I70.0). Bilateral L5 pars defect with grade 1 anterolisthesis Electronically Signed   By: Gilmer MorJaime  Wagner D.O.   On: 06/02/2018 13:36    Pending Labs Unresulted Labs (From admission, onward)    Start     Ordered   06/03/18 0500  Basic metabolic panel  Tomorrow morning,   R     06/02/18 1604   06/03/18 0500  CBC  Tomorrow morning,   R     06/02/18 1604   06/02/18 1604  Urine rapid drug screen (hosp performed)  ONCE - STAT,   R     Comments:  Do not give opiate pain medication until collected    06/02/18 1604   06/02/18 1603  RPR  (GC, Chlamydia, RPR Panel (PNL))  Once,   R     06/02/18 1604   06/02/18 1601  HIV antibody (Routine Testing)  Once,   R     06/02/18 1604   06/02/18 1512  SARS Coronavirus 2 (CEPHEID - Performed in [facility] hospital lab), Hosp Order  (Asymptomatic Patients Labs)  Once,   R    Question:  Rule Out  Answer:  Yes   06/02/18 1511          Vitals/Pain Today's Vitals   06/02/18 1319 06/02/18 1403 06/02/18 1403 06/02/18 1415  BP:    114/87  Pulse: 78 75  85  Resp: 14 14  15   Temp:      TempSrc:      SpO2: 98% 100%  100%  PainSc:   4      Isolation Precautions No active isolations  Medications Medications  piperacillin-tazobactam (ZOSYN) IVPB 3.375 g (3.375 g Intravenous New Bag/Given 06/02/18 1506)  sodium chloride 0.9 % 1,000 mL with potassium chloride 20 mEq infusion ( Intravenous New Bag/Given 06/02/18 1559)  acetaminophen (TYLENOL) tablet 650 mg (has no administration in time range)    Or  acetaminophen (TYLENOL) suppository 650 mg (has no administration in time range)  ondansetron (ZOFRAN) tablet 4 mg (has no administration in time range)    Or  ondansetron (ZOFRAN) injection 4 mg (has no administration in time range)  morphine 2 MG/ML injection 2 mg (has no administration in time range)  diphenhydrAMINE (BENADRYL) injection 12.5-25 mg (has no administration in time range)  famotidine (PEPCID) IVPB 20 mg premix (0  mg Intravenous Stopped 06/02/18 1132)  sodium chloride 0.9 % bolus 1,000 mL (0 mLs Intravenous Stopped 06/02/18 1439)  ketorolac (TORADOL) 15 MG/ML injection 15 mg (15 mg Intravenous Given 06/02/18 1252)  iohexol (OMNIPAQUE) 300 MG/ML solution 100 mL (100 mLs Intravenous Contrast Given 06/02/18 1318)    Mobility walks Low fall risk   Focused Assessments    R Recommendations: See Admitting Provider Note  Report given to:   Additional Notes:

## 2018-06-02 NOTE — Consult Note (Signed)
[facility] Surgery Consult Note  Efren Kross 1965-07-10  [id number].    Requesting MD: Meridee Score Chief Complaint/Reason for Consult: diverticulitis  HPI:  Jerome Burke is a 53yo male with no significant PMH who presented to [facility] earlier today complaining of persistent hives and multiple joint pain. States that 4 days ago he developed lower abdominal pain followed by hives. He reports having 2 prior episodes of abdominal pain followed hives, once in February and once 2 weeks ago. First time resolved without intervention, and the second time he received prednisone which helped. This time he describes his pain as lower/deep in his pelvis and rectum. Pain is worse when he has a bowel movement. No change with PO intake. Last BM was this morning and normal. At baseline he reports having a BM every time he eats. He also reports one fever of 102.2. Denies dysuria, nausea, vomiting. States that he went to urgent care on 4/17 and was given prednisone/hydroxazine for hives, and bactrim for presumed prostatitis. States that he only took 1 dose of bactrim and yesterday he developed pain in his jaw, shoulders, wrists, and elbows.  In the ED CT scan shows acute sigmoid diverticulitis with microperforation and 2.4cm abscess, splenomegaly of uncertain etiology. WBC 8.6, sed rate 35, CRP 16.4.  No prior h/o abdominal surgery. Never had a colonoscopy before. Quit smoking 6 years ago, now vapes daily Employment: real estate  ROS: Review of Systems  Constitutional: Positive for fever.  HENT: Negative.   Eyes: Negative.   Respiratory: Negative.   Cardiovascular: Negative.   Gastrointestinal: Positive for abdominal pain. Negative for constipation, diarrhea, nausea and vomiting.  Genitourinary: Negative.  Negative for dysuria.  Musculoskeletal: Positive for joint pain.  Skin: Positive for itching and rash.  Neurological: Negative.    All systems reviewed and otherwise negative except  for as above  No family history on file.  No past medical history on file.  No past surgical history on file.  Social History:  reports that he has been smoking. He does not have any smokeless tobacco history on file. He reports current alcohol use. He reports current drug use. Drug: Marijuana.  Allergies: No Known Allergies  (Not in a hospital admission)   Prior to Admission medications   Medication Sig Start Date End Date Taking? Authorizing Provider  ibuprofen (ADVIL,MOTRIN) 200 MG tablet Take 400 mg by mouth every 6 (six) hours as needed for mild pain.    [provider]  methocarbamol (ROBAXIN) 500 MG tablet Take 1 tablet (500 mg total) by mouth 2 (two) times daily as needed for muscle spasms. 05/05/15   Danelle Berry, PA-C  naproxen (NAPROSYN) 500 MG tablet Take 1 tablet (500 mg total) by mouth 2 (two) times daily with a meal. 05/05/15   Danelle Berry, PA-C  simethicone (MYLICON) 80 MG chewable tablet Chew 80 mg by mouth every 6 (six) hours as needed for flatulence.    [provider]    Blood pressure 116/75, pulse 75, temperature 98.2 F (36.8 C), temperature source Oral, resp. rate 14, SpO2 100 %. Physical Exam: General: pleasant, WD/WN white male who is laying in bed in NAD HEENT: head is normocephalic, atraumatic.  Sclera are noninjected.  Pupils equal and round.  Ears and nose without any masses or lesions.  Mouth is pink and moist. Dentition fair Heart: regular, rate, and rhythm.  No obvious murmurs, gallops, or rubs noted.  Palpable pedal pulses bilaterally Lungs: CTAB, no wheezes, rhonchi, or rales noted.  Respiratory  effort nonlabored Abd: soft, ND, +BS, no masses, hernias, or organomegaly. TTP mostly in suprapubic region with voluntary guarding, mild LLQ and RLQ tenderness, no peritonitis MS: calves soft and nontender without edema Skin: warm and dry. Diffuse hives in BUE, BLE, chest, abdomen, and back Psych: A&Ox3 with an appropriate affect. Neuro:  cranial nerves grossly intact, extremity CSM intact bilaterally, normal speech  Results for orders placed or performed during the hospital encounter of 06/02/18 (from the past 48 hour(s))  CBC with Differential     Status: Abnormal   Collection Time: 06/02/18  9:52 AM  Result Value Ref Range   WBC 8.6 4.0 - 10.5 K/uL   RBC 4.72 4.22 - 5.81 MIL/uL   Hemoglobin 14.9 13.0 - 17.0 g/dL   HCT 72.5 36.6 - 44.0 %   MCV 90.5 80.0 - 100.0 fL   MCH 31.6 26.0 - 34.0 pg   MCHC 34.9 30.0 - 36.0 g/dL   RDW 34.7 42.5 - 95.6 %   Platelets 208 150 - 400 K/uL   nRBC 0.0 0.0 - 0.2 %   Neutrophils Relative % 77 %   Neutro Abs 6.6 1.7 - 7.7 K/uL   Lymphocytes Relative 14 %   Lymphs Abs 1.2 0.7 - 4.0 K/uL   Monocytes Relative 5 %   Monocytes Absolute 0.5 0.1 - 1.0 K/uL   Eosinophils Relative 1 %   Eosinophils Absolute 0.1 0.0 - 0.5 K/uL   Basophils Relative 1 %   Basophils Absolute 0.0 0.0 - 0.1 K/uL   Immature Granulocytes 2 %   Abs Immature Granulocytes 0.19 (H) 0.00 - 0.07 K/uL    Comment: Performed at [facility], [address], [place], [place] [address]  Comprehensive metabolic panel     Status: Abnormal   Collection Time: 06/02/18  9:52 AM  Result Value Ref Range   Sodium 134 (L) 135 - 145 mmol/L   Potassium 3.6 3.5 - 5.1 mmol/L   Chloride 100 98 - 111 mmol/L   CO2 23 22 - 32 mmol/L   Glucose, Bld 94 70 - 99 mg/dL   BUN 13 6 - 20 mg/dL   Creatinine, Ser 4.33 0.61 - 1.24 mg/dL   Calcium 9.0 8.9 - 29.5 mg/dL   Total Protein 6.4 (L) 6.5 - 8.1 g/dL   Albumin 3.2 (L) 3.5 - 5.0 g/dL   AST 18 15 - 41 U/L   ALT 16 0 - 44 U/L   Alkaline Phosphatase 52 38 - 126 U/L   Total Bilirubin 1.1 0.3 - 1.2 mg/dL   GFR calc non Af Amer >60 >60 mL/min   GFR calc Af Amer >60 >60 mL/min   Anion gap 11 5 - 15    Comment: Performed at [facility], [address], [place], [place] [address]  Lipase, blood     Status: None   Collection Time: 06/02/18  9:52 AM  Result Value Ref Range   Lipase  37 11 - 51 U/L    Comment: Performed at [facility], [address] [place], [place] [address]  Sedimentation rate     Status: Abnormal   Collection Time: 06/02/18  9:52 AM  Result Value Ref Range   Sed Rate 35 (H) 0 - 16 mm/hr    Comment: Performed at [facility], [address] [place], [place] [address]  C-reactive protein     Status: Abnormal   Collection Time: 06/02/18 10:22 AM  Result Value Ref Range  CRP 16.4 (H) <1.0 mg/dL    Comment: Performed at [facility], [address] [place], [place] [address]  Urinalysis, Routine w reflex microscopic     Status: Abnormal   Collection Time: 06/02/18 10:34 AM  Result Value Ref Range   Color, Urine YELLOW YELLOW   APPearance CLEAR CLEAR   Specific Gravity, Urine <1.005 (L) 1.005 - 1.030   pH 6.5 5.0 - 8.0   Glucose, UA NEGATIVE NEGATIVE mg/dL   Hgb urine dipstick SMALL (A) NEGATIVE   Bilirubin Urine NEGATIVE NEGATIVE   Ketones, ur NEGATIVE NEGATIVE mg/dL   Protein, ur NEGATIVE NEGATIVE mg/dL   Nitrite NEGATIVE NEGATIVE   Leukocytes,Ua NEGATIVE NEGATIVE    Comment: Performed at [facility], [address] [place], [place] [address]  Urinalysis, Microscopic (reflex)     Status: Abnormal   Collection Time: 06/02/18 10:34 AM  Result Value Ref Range   RBC / HPF 0-5 0 - 5 RBC/hpf   WBC, UA 0-5 0 - 5 WBC/hpf   Bacteria, UA RARE (A) NONE SEEN   Squamous Epithelial / LPF 0-5 0 - 5    Comment: Performed at [facility], [address] [place], [place] [address]   Ct Abdomen Pelvis W Contrast  Result Date: 06/02/2018 CLINICAL DATA:  53 year old with lower abdominal pain EXAM: CT ABDOMEN AND PELVIS WITH CONTRAST TECHNIQUE: Multidetector CT imaging of the abdomen and pelvis was performed using the standard protocol following bolus administration of intravenous contrast. CONTRAST:  100mL OMNIPAQUE IOHEXOL 300 MG/ML  SOLN COMPARISON:  No prior CT FINDINGS: Lower chest: No acute finding of the lower chest  Hepatobiliary: Unremarkable liver.  Unremarkable gallbladder. Pancreas: Unremarkable pancreas Spleen: Cranial caudal span of the spleen measures greater than 14 cm. Adrenals/Urinary Tract: Unremarkable appearance of the adrenal glands. No evidence of hydronephrosis of the right or left kidney. No nephrolithiasis. Unremarkable course of the bilateral ureters. Unremarkable appearance of the urinary bladder. Stomach/Bowel: Unremarkable stomach. Unremarkable small bowel. No abnormal distention. No transition point. Normal appendix. Mild to moderate stool burden. Inflammatory changes of the sigmoid colon in a region of diverticular change. Circumferential wall thickening. Edema of the fat of the mesocolon. There is a small fluid and gas collection measuring 2.4 cm. Thickening of the fascial planes. Vascular/Lymphatic: Mild atherosclerotic changes of the abdominal aorta. Bilateral iliac arteries and proximal femoral arteries are patent. No adenopathy. Reproductive: Minimal calcifications of the prostate. Other: None Musculoskeletal: No acute displaced fracture. Bilateral L5 pars defect with grade 1 anterolisthesis. IMPRESSION: Acute sigmoid diverticulitis complicated by micro perforation and a 2.4 cm abscess. Splenomegaly of uncertain etiology. Aortic Atherosclerosis (ICD10-I70.0). Bilateral L5 pars defect with grade 1 anterolisthesis Electronically Signed   By: Gilmer MorJaime  Wagner D.O.   On: 06/02/2018 13:36      Assessment/Plan Hives/joint pain  Sigmoid diverticulitis with microperforation and 2.4cm abscess - Recommend medicine admission due to hives/joint pain. For his diverticulitis he does not need emergent surgery. Recommend IV zosyn and bowel rest. Repeat labs in AM. Hopefully this will resolve without surgery. If so, would recommend that he have a colonoscopy in 6-8 weeks.  ID - zosyn VTE - SCDs, ok for chemical DVT prophylaxis from surgical standpoint FEN - IVF, NPO Foley - none Follow up - TBD  Franne FortsBrooke  A Zamirah Denny, [facility] 06/02/2018, 2:30 PM Pager: [phone] Mon-Thurs 7:00 am-4:30 pm Fri 7:00 am -11:30 AM Sat-Sun 7:00 am-11:30 am

## 2018-06-02 NOTE — H&P (Signed)
History and Physical    Jerome Burke KDX:[id number] DOB: 12-Jan-1966 DOA: 06/02/2018  PCP: [place], [place] - has not been forever Consultants:  None Patient coming from:  Home - lives with girlfriend; NOK: Pandora Leiter, [phone], Berline Lopes  Chief Complaint:  Abdominal pain  HPI: Jerome Burke is a 53 y.o. male with no significant past medical history presenting with abdominal pain.   At the end of February, he had low GI pain, stabbing in his anus, difficulty having a BM.  6-7 hours later, he developed hives.  He went to the doctor and was given Prednisone and sent home.  After a single dose of prednisone (60 mg), the hives went away completely and he did not need additional medication.  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  2 weeks ago, he developed lower GI symptoms again.  Again, the hives developed a few days later.  This time, no shellfish.  He again took the prednisone and benadryl and the hives were resolved within a day ago.  Pain resolved and no further issues.  Once again, he started with pain Saturday AM and hives started Saturday afternoon.  He took one dose of prednisone and went to urgent care on Sunday AM.  He was given a shot "of something, a steroid most likely."  He as given an rx for prednisone.  He was given antibiotics.  Sunday late afternoon, he had a fever which lasted <12 hours.  He started with jaw pain Sunday night and radiated into his shoulders yesterday.  He thought he had lock jaw.  Pain was radiating into his wrists.  It never went anywhere else.  The abdominal pain started to improve Monday and never resolved.  This AM, he again broke out in hives and the pain increased some.  No further fever.  The joint pain was horrible this AM but has gradually improved through the day.  He has not been tested for GC/Chl.  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  No new partners, has been with partner for 6 years.  No IV drugs, smokes  marijuana periodically.  They wanted to wait to do the prostate exam to see how he responded to the antibiotics.      ED Course:   Hives starting Sunday - happened once before, attributed to shellfish allergy.  No shellfish this time.  Seen at [facility] the day of and was given Bactrim for prostate infection.  Clear UA today (small blood).  Now with rash and lower abdominal pain, joint pain, intermittent fever at home.  No WBC elevation.  Elevated CRP, ESR.  CT with diverticulitis with microperforation, abscess, splenomegaly.  Surgery consulted and requests [facility] admission.  He vapes marijuana.  Review of Systems: As per HPI; otherwise review of systems reviewed and negative.   Ambulatory Status:  Ambulates without assistance  Past Medical History:  Diagnosis Date   HSV-1 (herpes simplex virus 1) infection     Past Surgical History:  Procedure Laterality Date   VASECTOMY      Social History   Socioeconomic History   Marital status: Single    Spouse name: Not on file   Number of children: Not on file   Years of education: Not on file   Highest education level: Not on file  Occupational History   Occupation: real estate  Social Needs   Financial resource strain: Not on file   Food insecurity:    Worry: Not  on file    Inability: Not on file   Transportation needs:    Medical: Not on file    Non-medical: Not on file  Tobacco Use   Smoking status: Current Every Day Smoker   Smokeless tobacco: Never Used   Tobacco comment: vapes daily  Substance and Sexual Activity   Alcohol use: Yes    Comment: occ   Drug use: Yes    Types: Marijuana    Comment: occasional   Sexual activity: Yes    Partners: Female    Birth control/protection: None  Lifestyle   Physical activity:    Days per week: Not on file    Minutes per session: Not on file   Stress: Not on file  Relationships   Social connections:    Talks on phone: Not on file    Gets together: Not on file     Attends religious service: Not on file    Active member of club or organization: Not on file    Attends meetings of clubs or organizations: Not on file    Relationship status: Not on file   Intimate partner violence:    Fear of current or ex partner: Not on file    Emotionally abused: Not on file    Physically abused: Not on file    Forced sexual activity: Not on file  Other Topics Concern   Not on file  Social History Narrative   Not on file    No Known Allergies  History reviewed. No pertinent family history.  Prior to Admission medications   Medication Sig Start Date End Date Taking? Authorizing Provider  ibuprofen (ADVIL,MOTRIN) 200 MG tablet Take 400 mg by mouth every 6 (six) hours as needed for mild pain.    [provider]  methocarbamol (ROBAXIN) 500 MG tablet Take 1 tablet (500 mg total) by mouth 2 (two) times daily as needed for muscle spasms. 05/05/15   Delsa Grana, PA-C  naproxen (NAPROSYN) 500 MG tablet Take 1 tablet (500 mg total) by mouth 2 (two) times daily with a meal. 05/05/15   Delsa Grana, PA-C  simethicone (MYLICON) 80 MG chewable tablet Chew 80 mg by mouth every 6 (six) hours as needed for flatulence.    [provider]    Physical Exam: Vitals:   06/02/18 1246 06/02/18 1319 06/02/18 1403 06/02/18 1415  BP:    114/87  Pulse: 82 78 75 85  Resp: '13 14 14 15  ' Temp:      TempSrc:      SpO2: 98% 98% 100% 100%      General:  Appears calm and comfortable and is NAD  Eyes:  PERRL, EOMI, normal lids, iris  ENT:  grossly normal hearing, lips & tongue, mmm; appropriate dentition.  He has difficulty opening his mouth due to jaw pain and apparent trismus; his discomfort is located along the mid-mandible bilaterally.  Neck:  no LAD, masses or thyromegaly  Cardiovascular:  RRR, no m/r/g. No LE edema.   Respiratory:   CTA bilaterally with no wheezes/rales/rhonchi.  Normal respiratory effort.  Abdomen:  soft, NT, ND, NABS.  Possible very  scant TTP along the suprapubic region but negligible.  Back:   normal alignment, no CVAT  Skin:  Diffuse maculopapular erythematous rash, not clearly urticarial in nature            Musculoskeletal:  grossly normal tone BUE/BLE, good ROM, no bony abnormality  Psychiatric:  grossly normal mood and affect, speech fluent and appropriate,  AOx3  Neurologic:  CN 2-12 grossly intact, moves all extremities in coordinated fashion, sensation intact    Radiological Exams on Admission: Ct Abdomen Pelvis W Contrast  Result Date: 06/02/2018 CLINICAL DATA:  53 year old with lower abdominal pain EXAM: CT ABDOMEN AND PELVIS WITH CONTRAST TECHNIQUE: Multidetector CT imaging of the abdomen and pelvis was performed using the standard protocol following bolus administration of intravenous contrast. CONTRAST:  185m OMNIPAQUE IOHEXOL 300 MG/ML  SOLN COMPARISON:  No prior CT FINDINGS: Lower chest: No acute finding of the lower chest Hepatobiliary: Unremarkable liver.  Unremarkable gallbladder. Pancreas: Unremarkable pancreas Spleen: Cranial caudal span of the spleen measures greater than 14 cm. Adrenals/Urinary Tract: Unremarkable appearance of the adrenal glands. No evidence of hydronephrosis of the right or left kidney. No nephrolithiasis. Unremarkable course of the bilateral ureters. Unremarkable appearance of the urinary bladder. Stomach/Bowel: Unremarkable stomach. Unremarkable small bowel. No abnormal distention. No transition point. Normal appendix. Mild to moderate stool burden. Inflammatory changes of the sigmoid colon in a region of diverticular change. Circumferential wall thickening. Edema of the fat of the mesocolon. There is a small fluid and gas collection measuring 2.4 cm. Thickening of the fascial planes. Vascular/Lymphatic: Mild atherosclerotic changes of the abdominal aorta. Bilateral iliac arteries and proximal femoral arteries are patent. No adenopathy. Reproductive: Minimal calcifications of  the prostate. Other: None Musculoskeletal: No acute displaced fracture. Bilateral L5 pars defect with grade 1 anterolisthesis. IMPRESSION: Acute sigmoid diverticulitis complicated by micro perforation and a 2.4 cm abscess. Splenomegaly of uncertain etiology. Aortic Atherosclerosis (ICD10-I70.0). Bilateral L5 pars defect with grade 1 anterolisthesis Electronically Signed   By: JCorrie MckusickD.O.   On: 06/02/2018 13:36    EKG: not done   Labs on Admission: I have personally reviewed the available labs and imaging studies at the time of the admission.  Pertinent labs:   Unremarkable CMP CRP 16.4 Normal CBC ESR 35 UA: Small Hgb, rare bacteria COVID negative  Assessment/Plan Principal Problem:   Abscess of sigmoid colon due to diverticulitis Active Problems:   Genital HSV   Rash and nonspecific skin eruption   Jaw pain, non-TMJ   -Patient without significant known PMH other than HSV presenting with now 3 episodes of acute onset of rectal/GU pain followed by diffuse rash several hours later; both prior episodes resolved with a single dose of 60 mg Prednisone -Current episode started Saturday with the same symptoms, but he also developed acute jaw pain with ?trismus, shoulder pain and arm pain.  His rash did not resolve with Prednisone this time, although the rectal/GU pain eased and then recurred. -Labs are remarkably normal other than CRP 16.4 -CT with acute sigmoid diverticulitis with microperf and 2.4 cm abscess -DDx is broad and includes: Leukocytoclastic vasculitis associated with perforated diverticular disease (case report in [place] 2016); Crohn's colitis with skin manifestations and arthralgias; autoimmune arteritis; diffuse embolic phenomenon from IVDA; or a GU source like GC or syphilis with resultant rash, likely with secondary inflammation to the sigmoid colon. -For now, will admit -Strict NPO -Zosyn -Morphine as needed for pain -Benadryl as needed for itching -HIV, RPR,  Urine GC/Chl, and UDS all pending -If not improving tomorrow, consider addition of steroids - although surgery prefers that we not use these if possible -ID and/or GI may also be of assistance if not improving -Surgery recommends colonoscopy 6-8 weeks after resolution of current symptoms.   Note: This patient has been tested and is negative for the novel coronavirus COVID-19.  DVT prophylaxis:  SCDs Code Status:  Full - confirmed with patient Family Communication: None present Disposition Plan:  Home once clinically improved Consults called: Surgery  Admission status: Admit - It is my clinical opinion that admission to INPATIENT is reasonable and necessary because of the expectation that this patient will require hospital care that crosses at least 2 midnights to treat this condition based on the medical complexity of the problems presented.  Given the aforementioned information, the predictability of an adverse outcome is felt to be significant.    Karmen Bongo MD [facility]   How to contact the [facility] Attending or Consulting provider [place] or covering provider during after hours [place], for this patient?  1. Check the care team in [facility] and look for a) attending/consulting TR[facility] provider listed and b) the [facility] team listed 2. Log into [email or web address] and use [place] universal password to access. If you do not have the password, please contact the hospital operator. 3. Locate the [facility] provider you are looking for under [facility] and page to a number that you can be directly reached. 4. If you still have difficulty reaching the provider, please page the [facility] (Director on Call) for the Hospitalists listed on amion for assistance.   06/02/2018, 4:10 PM

## 2018-06-02 NOTE — ED Triage Notes (Signed)
Pt in with abdominal pain and hives x 5 days. States hives worse today, reports intermittent fevers

## 2018-06-02 NOTE — ED Provider Notes (Signed)
[facility] EMERGENCY DEPARTMENT Provider Note   CSN: [id number] Arrival date & time: 06/02/18  4098    History   Chief Complaint Chief Complaint  Patient presents with   Abdominal Pain   Urticaria    HPI Jerome Burke is a 53 y.o. male.     53 y/o male with no PMH presents to the ED with a chief complaint of abdominal pain x 4 days. Patient reports cramping along the lower abdominal region with radiation to his anus, worse with bowel movements. He reports a new episode which began this past Sunday where he was seen at [facility], given a prescription for Bactrim along with an injection unknown what the injection was but was told it was to help with his joint pain.  He reports pain along the shoulders, jaw, wrist.  Patient reports taking prednisone, Advil, Bactrim, some marijuana for relief with mild improvement.  States the hives improved since Monday but returned today with worsening lower abdominal pain.  He endorses some anorexia.  Denies any previous history of IBS, nausea, vomiting, fevers or urinary symptoms.  Patient does endorse vaping.  Of note, patient had a similar episode at the end of February, reports breaking into hives along with lower abdominal pain which they thought was brought on by eating a large amount of crab legs.  Patient reports he was told by his PCP that he likely developed an allergy to shellfish.  To be mindful of this in future episodes.  He denies eating any shellfish this time, changes in soap, changes in new medication aside from the Bactrim was given after his first hives outbreak.         Home Medications    Prior to Admission medications   Medication Sig Start Date End Date Taking? Authorizing Provider  ibuprofen (ADVIL,MOTRIN) 200 MG tablet Take 400 mg by mouth every 6 (six) hours as needed for mild pain.    [provider]  methocarbamol (ROBAXIN) 500 MG tablet Take 1 tablet (500 mg total) by mouth 2 (two)  times daily as needed for muscle spasms. 05/05/15   Danelle Berry, PA-C  naproxen (NAPROSYN) 500 MG tablet Take 1 tablet (500 mg total) by mouth 2 (two) times daily with a meal. 05/05/15   Danelle Berry, PA-C  simethicone (MYLICON) 80 MG chewable tablet Chew 80 mg by mouth every 6 (six) hours as needed for flatulence.    [provider]    Family History No family history on file.  Social History Social History   Tobacco Use   Smoking status: Current Every Day Smoker  Substance Use Topics   Alcohol use: Yes    Comment: occ   Drug use: Yes    Types: Marijuana     Allergies   Patient has no known allergies.   Review of Systems Review of Systems  Constitutional: Negative for chills and fever.  HENT: Negative for ear pain and sore throat.   Eyes: Negative for pain and visual disturbance.  Respiratory: Negative for cough and shortness of breath.   Cardiovascular: Negative for chest pain and palpitations.  Gastrointestinal: Positive for abdominal pain. Negative for nausea and vomiting.  Genitourinary: Negative for dysuria and hematuria.  Musculoskeletal: Positive for arthralgias. Negative for back pain and joint swelling.  Skin: Positive for rash. Negative for color change.  Neurological: Negative for seizures and syncope.  All other systems reviewed and are negative.    Physical Exam Updated Vital Signs BP 114/87  Pulse 85    Temp 98.2 F (36.8 C) (Oral) Comment: Simultaneous filing. User may not have seen previous data. Comment (Src): Simultaneous filing. User may not have seen previous data.   Resp 15    SpO2 100%   Physical Exam Vitals signs and nursing note reviewed.  Constitutional:      Appearance: He is well-developed.     Comments: Non-ill-appearing.  HENT:     Head: Normocephalic and atraumatic.     Mouth/Throat:     Comments: No mucosal involvement.  Eyes:     General: No scleral icterus.    Pupils: Pupils are equal, round, and reactive to  light.  Neck:     Musculoskeletal: Normal range of motion.  Cardiovascular:     Heart sounds: Normal heart sounds.  Pulmonary:     Effort: Pulmonary effort is normal.     Breath sounds: Normal breath sounds. No wheezing.     Comments: Lungs are clear to auscultation, no wheezing, rhonchi or rales. Chest:     Chest wall: No tenderness.     Comments: No tenderness upon palpation of the chest.  Abdominal:     General: Bowel sounds are normal. There is no distension.     Palpations: Abdomen is soft.     Tenderness: There is abdominal tenderness in the suprapubic area. There is no right CVA tenderness or left CVA tenderness.  Musculoskeletal:        General: No tenderness or deformity.  Skin:    General: Skin is warm and dry.     Findings: Rash present. Rash is urticarial.          Comments: Well-demarcated urticarial rash along upper extremities, lower extremities and trunk region.  Sparing the face.  Neurological:     Mental Status: He is alert and oriented to person, place, and time.      ED Treatments / Results  Labs (all labs ordered are listed, but only abnormal results are displayed) Labs Reviewed  CBC WITH DIFFERENTIAL/PLATELET - Abnormal; Notable for the following components:      Result Value   Abs Immature Granulocytes 0.19 (*)    All other components within normal limits  COMPREHENSIVE METABOLIC PANEL - Abnormal; Notable for the following components:   Sodium 134 (*)    Total Protein 6.4 (*)    Albumin 3.2 (*)    All other components within normal limits  URINALYSIS, ROUTINE W REFLEX MICROSCOPIC - Abnormal; Notable for the following components:   Specific Gravity, Urine <1.005 (*)    Hgb urine dipstick SMALL (*)    All other components within normal limits  SEDIMENTATION RATE - Abnormal; Notable for the following components:   Sed Rate 35 (*)    All other components within normal limits  C-REACTIVE PROTEIN - Abnormal; Notable for the following components:    CRP 16.4 (*)    All other components within normal limits  URINALYSIS, MICROSCOPIC (REFLEX) - Abnormal; Notable for the following components:   Bacteria, UA RARE (*)    All other components within normal limits  LIPASE, BLOOD    EKG None  Radiology Ct Abdomen Pelvis W Contrast  Result Date: 06/02/2018 CLINICAL DATA:  53 year old with lower abdominal pain EXAM: CT ABDOMEN AND PELVIS WITH CONTRAST TECHNIQUE: Multidetector CT imaging of the abdomen and pelvis was performed using the standard protocol following bolus administration of intravenous contrast. CONTRAST:  OMNIPAQUE IOHEXOL 300 MG/ML  SOLN COMPARISON:  No prior CT FINDINGS: Lower chest: No  acute finding of the lower chest Hepatobiliary: Unremarkable liver.  Unremarkable gallbladder. Pancreas: Unremarkable pancreas Spleen: Cranial caudal span of the spleen measures greater than 14 cm. Adrenals/Urinary Tract: Unremarkable appearance of the adrenal glands. No evidence of hydronephrosis of the right or left kidney. No nephrolithiasis. Unremarkable course of the bilateral ureters. Unremarkable appearance of the urinary bladder. Stomach/Bowel: Unremarkable stomach. Unremarkable small bowel. No abnormal distention. No transition point. Normal appendix. Mild to moderate stool burden. Inflammatory changes of the sigmoid colon in a region of diverticular change. Circumferential wall thickening. Edema of the fat of the mesocolon. There is a small fluid and gas collection measuring 2.4 cm. Thickening of the fascial planes. Vascular/Lymphatic: Mild atherosclerotic changes of the abdominal aorta. Bilateral iliac arteries and proximal femoral arteries are patent. No adenopathy. Reproductive: Minimal calcifications of the prostate. Other: None Musculoskeletal: No acute displaced fracture. Bilateral L5 pars defect with grade 1 anterolisthesis. IMPRESSION: Acute sigmoid diverticulitis complicated by micro perforation and a 2.4 cm abscess. Splenomegaly of  uncertain etiology. Aortic Atherosclerosis (ICD10-I70.0). Bilateral L5 pars defect with grade 1 anterolisthesis Electronically Signed   By: Gilmer Mor D.O.   On: 06/02/2018 13:36    Procedures Procedures (including critical care time)  Medications Ordered in ED Medications  piperacillin-tazobactam (ZOSYN) IVPB 3.375 g (3.375 g Intravenous New Bag/Given 06/02/18 1506)  sodium chloride 0.9 % 1,000 mL with potassium chloride 20 mEq infusion (has no administration in time range)  famotidine (PEPCID) IVPB 20 mg premix (0 mg Intravenous Stopped 06/02/18 1132)  sodium chloride 0.9 % bolus 1,000 mL (0 mLs Intravenous Stopped 06/02/18 1439)  ketorolac (TORADOL) 15 MG/ML injection 15 mg (15 mg Intravenous Given 06/02/18 1252)  iohexol (OMNIPAQUE) 300 MG/ML solution 100 mL (100 mLs Intravenous Contrast Given 06/02/18 1318)     Initial Impression / Assessment and Plan / ED Course  I have reviewed the triage vital signs and the nursing notes.  Pertinent labs & imaging results that were available during my care of the patient were reviewed by me and considered in my medical decision making (see chart for details).  Clinical Course as of Jun 01 1509  Wed Jun 02, 2018  2225 53 year old male with no significant medical history here with hives joint aches and abdominal pain.  He was recently seen in urgent care and treated with Bactrim for possible prostatitis.  He is diffusely tender suprapubic and looks uncomfortable.  His lab work was fairly unremarkable but his CT shows acute diverticulitis with a microperforation and a small abscess.  I have updated the patient with these results and we will order him some antibiotics.  Surgery to be called for recommendations.   [MB]    Clinical Course User Index [MB] Terrilee Files, MD    Patient with a past medical history presents to the ED with complaints of lower abdominal pain along with hives throughout his body.  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  Reports a second episodes there was no previous encounter with shellfish, denies any changes in soap, or new medication.  [facility], given a prescription for Bactrim to treat "a problem with his prostate ".  Reports rash had improved along with lower abdominal pain, he stopped taking any of this medication the following day.  Reports this morning rash return with worsening arthralgia along with lower abdominal pain.  Taken Benadryl prior to arrival.  CMP showed slight decrease in sodium but stable, creatinine level is unremarkable.  LFTs are unremarkable.  CBC showed no leukocytosis, hemoglobin is normal.  Lipase is normal.  C-reactive protein along with sed rate were obtained.  C-reactive protein was elevated at 16.4, sedimentation rate was elevated at 35.  Patient was given famotidine 20 mg IV for improvement in hives as he reports taking 50 mg of Benadryl prior to arrival. Patient reports his last sexual encounter was the day prior to the rash or 5 days prior to the rash unsure which 1 of the 2, denies any penile discharge, groin swelling some suspicion for Disseminated gonococcal although rash does not fit the description. UA showed no nitrites, leukocytes, small amount of hemoglobin.  Rare bacteria.  CT Abdomen pelvis with contrast showed: Acute sigmoid diverticulitis complicated by micro perforation and a  2.4 cm abscess.    Splenomegaly of uncertain etiology.    Aortic Atherosclerosis (ICD10-I70.0).    Bilateral L5 pars defect with grade 1 anterolisthesis     Will place call for general surgery, appreciate their recommendations.  Spoke to Peabody EnergyMichael Macizs PA from general surgery, they will evaluate patient in the ED. 3:09 PM Per surgery consultation, hospitalist to admit.  Spoke to Dr. Ophelia CharterYates who will admit the patient for further management.     Final Clinical  Impressions(s) / ED Diagnoses   Final diagnoses:  Diverticulitis of small intestine with perforation and abscess without bleeding  Multiple joint pain  Lower abdominal pain    ED Discharge Orders    None       Claude MangesSoto, Elmina Hendel, PA-C 06/02/18 1511    Terrilee FilesButler, Lemmie C, MD 06/03/18 [phone]

## 2018-06-03 ENCOUNTER — Other Ambulatory Visit: Payer: Self-pay

## 2018-06-03 DIAGNOSIS — R21 Rash and other nonspecific skin eruption: Secondary | ICD-10-CM

## 2018-06-03 LAB — CBC
HCT: 35.3 % — ABNORMAL LOW (ref 39.0–52.0)
Hemoglobin: 12.3 g/dL — ABNORMAL LOW (ref 13.0–17.0)
MCH: 31.1 pg (ref 26.0–34.0)
MCHC: 34.8 g/dL (ref 30.0–36.0)
MCV: 89.4 fL (ref 80.0–100.0)
Platelets: 190 10*3/uL (ref 150–400)
RBC: 3.95 MIL/uL — ABNORMAL LOW (ref 4.22–5.81)
RDW: 11.8 % (ref 11.5–15.5)
WBC: 7.3 10*3/uL (ref 4.0–10.5)
nRBC: 0 % (ref 0.0–0.2)

## 2018-06-03 LAB — BASIC METABOLIC PANEL
Anion gap: 10 (ref 5–15)
BUN: 14 mg/dL (ref 6–20)
CO2: 23 mmol/L (ref 22–32)
Calcium: 8.5 mg/dL — ABNORMAL LOW (ref 8.9–10.3)
Chloride: 105 mmol/L (ref 98–111)
Creatinine, Ser: 1.04 mg/dL (ref 0.61–1.24)
GFR calc Af Amer: >60 mL/min (ref >60)
GFR calc non Af Amer: >60 mL/min (ref >60)
Glucose, Bld: 75 mg/dL (ref 70–99)
Potassium: 4.2 mmol/L (ref 3.5–5.1)
Sodium: 138 mmol/L (ref 135–145)

## 2018-06-03 LAB — RAPID URINE DRUG SCREEN, HOSP PERFORMED
Amphetamines: NOT DETECTED
Barbiturates: NOT DETECTED
Benzodiazepines: NOT DETECTED
Cocaine: NOT DETECTED
Opiates: NOT DETECTED
Tetrahydrocannabinol: POSITIVE — AB

## 2018-06-03 LAB — HIV ANTIBODY (ROUTINE TESTING W REFLEX): HIV Screen 4th Generation wRfx: NONREACTIVE

## 2018-06-03 LAB — RPR: RPR Ser Ql: NONREACTIVE

## 2018-06-03 MED ORDER — ACETAMINOPHEN 325 MG PO TABS: 650.0000 mg | ORAL_TABLET | Freq: Four times a day (QID) | ORAL | Status: DC | PRN

## 2018-06-03 MED ORDER — POTASSIUM CHLORIDE IN NACL 20-0.9 MEQ/L-% IV SOLN
INTRAVENOUS | Status: DC
  Administered 2018-06-03 – 2018-06-04 (×5): via INTRAVENOUS
  Filled 2018-06-03 (×4): qty 1000

## 2018-06-03 NOTE — Progress Notes (Signed)
PROGRESS NOTE    Jerome Burke  ZOX:[id number] DOB: 04-24-1965 DOA: 06/02/2018 PCP: No primary care provider on file.   Brief Narrative: Jerome Burke is a 53 y.o. male with a history of diverticulitis. He presented secondary to worsening abdominal pain and rash, found to have a abscess of sigmoid colon. Started on IV antibiotics.   Assessment & Plan:   Principal Problem:   Abscess of sigmoid colon due to diverticulitis Active Problems:   Genital HSV   Rash and nonspecific skin eruption   Jaw pain, non-TMJ   Abscess of sigmoid colon Acute diverticulitis General surgery consulted. Started on Zosyn IV. Improved symptomatically.   Rash Likely drug reaction secondary to Bactrim. Improved. Bactrim added to allergy list.  Jaw pain Improved. Unknown etiology. No concern for abscess/infection.   DVT prophylaxis: SCDs Code Status:   Code Status: Full Code Family Communication: None Disposition Plan: Discharge likely in 24 hours once diet advanced and per general surgery recommendations   Consultants:   General surgery  Procedures:   None  Antimicrobials:  Zosyn IV (5/13>>    Subjective: Pain improved.  Objective: Vitals:   06/02/18 1415 06/02/18 1656 06/02/18 2033 06/03/18 0437  BP: 114/87 127/88 120/77 121/70  Pulse: 85 78 82 71  Resp: Temp:  99.2 F (37.3 C) 99.2 F (37.3 C) 98 F (36.7 C)  TempSrc:  Oral Oral Oral  SpO2: 100% 100% 98% 97%    Intake/Output Summary (Last 24 hours) at 06/03/2018 1241 Last data filed at 06/02/2018 2100 Gross per 24 hour  Intake 1000 ml  Output --  Net 1000 ml   There were no vitals filed for this visit.  Examination:  General exam: Appears calm and comfortable Respiratory system: Clear to auscultation. Respiratory effort normal. Cardiovascular system: S1 & S2 heard, RRR. No murmurs, rubs, gallops or clicks. Gastrointestinal system: Abdomen is nondistended, soft and nontender. No organomegaly  or masses felt. Normal bowel sounds heard. Central nervous system: Alert and oriented. No focal neurological deficits. Extremities: No edema. No calf tenderness Skin: No cyanosis. Macular rash of arms, thighs, abdomen Psychiatry: Judgement and insight appear normal. Mood & affect appropriate.     Data Reviewed: I have personally reviewed following labs and imaging studies  CBC: Recent Labs  Lab 06/02/18 0952 06/03/18 0319  WBC 8.6 7.3  NEUTROABS 6.6  --   HGB 14.9 12.3*  HCT 42.7 35.3*  MCV 90.5 89.4  PLT 208 190   Basic Metabolic Panel: Recent Labs  Lab 06/02/18 0952 06/03/18 0319  NA 134* 138  K 3.6 4.2  CL 100 105  CO2 23 23  GLUCOSE 94 75  BUN 13 14  CREATININE 1.06 1.04  CALCIUM 9.0 8.5*   GFR: CrCl cannot be calculated (Unknown ideal weight.). Liver Function Tests: Recent Labs  Lab 06/02/18 0952  AST 18  ALT 16  ALKPHOS 52  BILITOT 1.1  PROT 6.4*  ALBUMIN 3.2*   Recent Labs  Lab 06/02/18 0952  LIPASE 37   No results for input(s): AMMONIA in the last 168 hours. Coagulation Profile: No results for input(s): INR, PROTIME in the last 168 hours. Cardiac Enzymes: No results for input(s): CKTOTAL, CKMB, CKMBINDEX, TROPONINI in the last 168 hours. BNP (last 3 results) No results for input(s): PROBNP in the last 8760 hours. HbA1C: No results for input(s): HGBA1C in the last 72 hours. CBG: No results for input(s): GLUCAP in the last 168 hours. Lipid Profile: No results for input(s): CHOL,  HDL, LDLCALC, TRIG, CHOLHDL, LDLDIRECT in the last 72 hours. Thyroid Function Tests: No results for input(s): TSH, T4TOTAL, FREET4, T3FREE, THYROIDAB in the last 72 hours. Anemia Panel: No results for input(s): VITAMINB12, FOLATE, FERRITIN, TIBC, IRON, RETICCTPCT in the last 72 hours. Sepsis Labs: No results for input(s): PROCALCITON, LATICACIDVEN in the last 168 hours.  Recent Results (from the past 240 hour(s))  SARS Coronavirus 2 (CEPHEID - Performed in [facility] hospital lab), Hosp Order     Status: None   Collection Time: 06/02/18  3:19 PM  Result Value Ref Range Status   SARS Coronavirus 2 NEGATIVE NEGATIVE Final    Comment: (NOTE) If result is NEGATIVE SARS-CoV-2 target nucleic acids are NOT DETECTED. The SARS-CoV-2 RNA is generally detectable in upper and lower  respiratory specimens during the acute phase of infection. The lowest  concentration of SARS-CoV-2 viral copies this assay can detect is 250  copies / mL. A negative result does not preclude SARS-CoV-2 infection  and should not be used as the sole basis for treatment or other  patient management decisions.  A negative result may occur with  improper specimen collection / handling, submission of specimen other  than nasopharyngeal swab, presence of viral mutation(s) within the  areas targeted by this assay, and inadequate number of viral copies  (<250 copies / mL). A negative result must be combined with clinical  observations, patient history, and epidemiological information. If result is POSITIVE SARS-CoV-2 target nucleic acids are DETECTED. The SARS-CoV-2 RNA is generally detectable in upper and lower  respiratory specimens dur ing the acute phase of infection.  Positive  results are indicative of active infection with SARS-CoV-2.  Clinical  correlation with patient history and other diagnostic information is  necessary to determine patient infection status.  Positive results do  not rule out bacterial infection or co-infection with other viruses. If result is PRESUMPTIVE POSTIVE SARS-CoV-2 nucleic acids MAY BE PRESENT.   A presumptive positive result was obtained on the submitted specimen  and confirmed on repeat testing.  While 2019 novel coronavirus  (SARS-CoV-2) nucleic acids may be present in the submitted sample  additional confirmatory testing may be necessary for epidemiological  and / or clinical management purposes  to differentiate between  SARS-CoV-2 and  other Sarbecovirus currently known to infect humans.  If clinically indicated additional testing with an alternate test  methodology [phone] is advised. The SARS-CoV-2 RNA is generally  detectable in upper and lower respiratory sp ecimens during the acute  phase of infection. The expected result is Negative. Fact Sheet for Patients:  BoilerBrush.com.cyhttps://www.fda.gov/media/136312/download Fact Sheet for Healthcare Providers: https://pope.com/https://www.fda.gov/media/136313/download This test is not yet approved or cleared by the [place] FDA and has been authorized for detection and/or diagnosis of SARS-CoV-2 by FDA under an Emergency Use Authorization (EUA).  This EUA will remain in effect (meaning this test can be used) for the duration of the COVID-19 declaration under Section 564(b)(1) of the Act, 21 U.S.C. section 360bbb-3(b)(1), unless the authorization is terminated or revoked sooner. Performed at [facility], [address] [place], [place] [address]          Radiology Studies: Ct Abdomen Pelvis W Contrast  Result Date: 06/02/2018 CLINICAL DATA:  53 year old with lower abdominal pain EXAM: CT ABDOMEN AND PELVIS WITH CONTRAST TECHNIQUE: Multidetector CT imaging of the abdomen and pelvis was performed using the standard protocol following bolus administration of intravenous contrast. CONTRAST:  100mL OMNIPAQUE IOHEXOL 300 MG/ML  SOLN COMPARISON:  No  prior CT FINDINGS: Lower chest: No acute finding of the lower chest Hepatobiliary: Unremarkable liver.  Unremarkable gallbladder. Pancreas: Unremarkable pancreas Spleen: Cranial caudal span of the spleen measures greater than 14 cm. Adrenals/Urinary Tract: Unremarkable appearance of the adrenal glands. No evidence of hydronephrosis of the right or left kidney. No nephrolithiasis. Unremarkable course of the bilateral ureters. Unremarkable appearance of the urinary bladder. Stomach/Bowel: Unremarkable stomach. Unremarkable small bowel. No abnormal  distention. No transition point. Normal appendix. Mild to moderate stool burden. Inflammatory changes of the sigmoid colon in a region of diverticular change. Circumferential wall thickening. Edema of the fat of the mesocolon. There is a small fluid and gas collection measuring 2.4 cm. Thickening of the fascial planes. Vascular/Lymphatic: Mild atherosclerotic changes of the abdominal aorta. Bilateral iliac arteries and proximal femoral arteries are patent. No adenopathy. Reproductive: Minimal calcifications of the prostate. Other: None Musculoskeletal: No acute displaced fracture. Bilateral L5 pars defect with grade 1 anterolisthesis. IMPRESSION: Acute sigmoid diverticulitis complicated by micro perforation and a 2.4 cm abscess. Splenomegaly of uncertain etiology. Aortic Atherosclerosis (ICD10-I70.0). Bilateral L5 pars defect with grade 1 anterolisthesis Electronically Signed   By: Gilmer Mor D.O.   On: 06/02/2018 13:36        Scheduled Meds: Continuous Infusions:  0.9 % NaCl with KCl 20 mEq / L 125 mL/hr at 06/03/18 0807   piperacillin-tazobactam (ZOSYN)  IV 3.375 g (06/03/18 0536)     LOS: 1 day     Jacquelin Hawking, MD [facility] 06/03/2018, 12:41 PM  If 7PM-7AM, please contact night-coverage [email or web address]

## 2018-06-03 NOTE — Progress Notes (Signed)
[facility] Progress Note     Subjective: CC-  Did not sleep well last night, but overall feeling better. He reports only mild lower abdominal pain. Denies n/v. No BM since admission. Denies dysuria. Hives improving.  WBC 7.3, TMAX 99.2.  Objective: Vital signs in last 24 hours: Temp:  [98 F (36.7 C)-99.2 F (37.3 C)] 98 F (36.7 C) (05/14 0437) Pulse Rate:  [71-91] 71 (05/14 0437) Resp:  [12-19] 18 (05/14 0437) BP: (114-135)/(70-99) 121/70 (05/14 0437) SpO2:  [97 %-100 %] 97 % (05/14 0437) Last BM Date: 06/02/18  Intake/Output from previous day: 05/13 0701 - 05/14 0700 In: 1000 [IV Piggyback:1000] Out: -  Intake/Output this shift: No intake/output data recorded.  PE: Gen:  Alert, NAD, pleasant HEENT: EOM's intact, pupils equal and round Card:  RRR Pulm:  CTAB, no W/R/R, effort normal Abd: Soft, ND, +BS, no HSM, mild suprapubic and LLQ TTP without rebound or guarding Ext: calves soft and nontender without edema Psych: A&Ox3  Skin: no rashes noted, warm and dry  Lab Results:  Recent Labs    06/02/18 0952 06/03/18 0319  WBC 8.6 7.3  HGB 14.9 12.3*  HCT 42.7 35.3*  PLT 208 190   BMET Recent Labs    06/02/18 0952 06/03/18 0319  NA 134* 138  K 3.6 4.2  CL 100 105  CO2 23 23  GLUCOSE 94 75  BUN 13 14  CREATININE 1.06 1.04  CALCIUM 9.0 8.5*   PT/INR No results for input(s): LABPROT, INR in the last 72 hours. CMP     Component Value Date/Time   NA 138 06/03/2018 0319   K 4.2 06/03/2018 0319   CL 105 06/03/2018 0319   CO2 23 06/03/2018 0319   GLUCOSE 75 06/03/2018 0319   BUN 14 06/03/2018 0319   CREATININE 1.04 06/03/2018 0319   CALCIUM 8.5 (L) 06/03/2018 0319   PROT 6.4 (L) 06/02/2018 0952   ALBUMIN 3.2 (L) 06/02/2018 0952   AST 18 06/02/2018 0952   ALT 16 06/02/2018 0952   ALKPHOS 52 06/02/2018 0952   BILITOT 1.1 06/02/2018 0952   GFRNONAA >60 06/03/2018 0319   GFRAA >60 06/03/2018 0319   Lipase     Component Value  Date/Time   LIPASE 37 06/02/2018 0952       Studies/Results: Ct Abdomen Pelvis W Contrast  Result Date: 06/02/2018 CLINICAL DATA:  53 year old with lower abdominal pain EXAM: CT ABDOMEN AND PELVIS WITH CONTRAST TECHNIQUE: Multidetector CT imaging of the abdomen and pelvis was performed using the standard protocol following bolus administration of intravenous contrast. CONTRAST:  OMNIPAQUE IOHEXOL 300 MG/ML  SOLN COMPARISON:  No prior CT FINDINGS: Lower chest: No acute finding of the lower chest Hepatobiliary: Unremarkable liver.  Unremarkable gallbladder. Pancreas: Unremarkable pancreas Spleen: Cranial caudal span of the spleen measures greater than 14 cm. Adrenals/Urinary Tract: Unremarkable appearance of the adrenal glands. No evidence of hydronephrosis of the right or left kidney. No nephrolithiasis. Unremarkable course of the bilateral ureters. Unremarkable appearance of the urinary bladder. Stomach/Bowel: Unremarkable stomach. Unremarkable small bowel. No abnormal distention. No transition point. Normal appendix. Mild to moderate stool burden. Inflammatory changes of the sigmoid colon in a region of diverticular change. Circumferential wall thickening. Edema of the fat of the mesocolon. There is a small fluid and gas collection measuring 2.4 cm. Thickening of the fascial planes. Vascular/Lymphatic: Mild atherosclerotic changes of the abdominal aorta. Bilateral iliac arteries and proximal femoral arteries are patent. No adenopathy. Reproductive: Minimal calcifications of the prostate.  Other: None Musculoskeletal: No acute displaced fracture. Bilateral L5 pars defect with grade 1 anterolisthesis. IMPRESSION: Acute sigmoid diverticulitis complicated by micro perforation and a 2.4 cm abscess. Splenomegaly of uncertain etiology. Aortic Atherosclerosis (ICD10-I70.0). Bilateral L5 pars defect with grade 1 anterolisthesis Electronically Signed   By: Gilmer MorJaime  Wagner D.O.   On: 06/02/2018 13:36     Anti-infectives: Anti-infectives (From admission, onward)   Start     Dose/Rate Route Frequency Ordered Stop   06/02/18 1500  piperacillin-tazobactam (ZOSYN) IVPB 3.375 g     3.375 g 12.5 mL/hr over 240 Minutes Intravenous Every 8 hours 06/02/18 1446         Assessment/Plan Hives/joint pain Splenomegaly  Sigmoid diverticulitis with microperforation and 2.4cm abscess - never had a colonoscopy before - CT scan 5/13 as above - Will need colonoscopy 6-8 wks out from resolution of this attack  - abdominal pain improving, WBC WNL, VSS  ID - zosyn 5/13>> VTE - SCDs, ok for chemical DVT prophylaxis from surgical standpoint FEN - IVF, CLD Foley - none Follow up - TBD  Plan: Advance to clear liquids. Continue IV zosyn.  Appreciate medicine assistance with hive/joint pain work up.   LOS: 1 day    Franne FortsBrooke A Hosteen Kienast , [facility] 06/03/2018, 8:00 AM Pager: [phone] Mon-Thurs 7:00 am-4:30 pm Fri 7:00 am -11:30 AM Sat-Sun 7:00 am-11:30 am

## 2018-06-04 MED ORDER — AMOXICILLIN-POT CLAVULANATE 875-125 MG PO TABS
1.0000 | ORAL_TABLET | Freq: Two times a day (BID) | ORAL | Status: DC
  Administered 2018-06-04: 10:00:00 1 via ORAL
  Filled 2018-06-04: qty 1

## 2018-06-04 MED ORDER — AMOXICILLIN-POT CLAVULANATE 875-125 MG PO TABS: 1.0000 | ORAL_TABLET | Freq: Two times a day (BID) | ORAL | 0 refills | Status: DC

## 2018-06-04 MED ORDER — CEFDINIR 300 MG PO CAPS: 300.0000 mg | ORAL_CAPSULE | Freq: Two times a day (BID) | ORAL | 0 refills | Status: AC

## 2018-06-04 MED ORDER — METRONIDAZOLE 500 MG PO TABS: 500.0000 mg | ORAL_TABLET | Freq: Three times a day (TID) | ORAL | 0 refills | Status: AC

## 2018-06-04 NOTE — Discharge Instructions (Signed)
Continue low fiber diet for the next 2-3 weeks until you are finished with your antibiotics. Once your abdominal pain is resolved and you are off of antibiotics you may resume a high fiber diet. Establish a primary care physician so you can arrange for a colonoscopy in 6-8 weeks   Low-Fiber Eating Plan Fiber is found in fruits, vegetables, whole grains, and beans. Eating a diet low in fiber helps to reduce how often you have bowel movements and how much you produce during a bowel movement. A low-fiber eating plan may help your digestive system heal if:  You have certain conditions, such as Crohn's disease or diverticulitis.  You recently had radiation therapy on your pelvis or bowel.  You recently had intestinal surgery.  You have a new surgical opening in your abdomen (colostomy or ileostomy).  Your intestine is narrowed (stricture). Your health care provider will determine how long you need to stay on this diet. Your health care provider may recommend that you work with a diet and nutrition specialist (dietitian). What are tips for following this plan? General guidelines  Follow recommendations from your dietitian about how much fiber you should have each day.  Most people on this eating plan should try to eat less than 10 grams (g) of fiber each day. Your daily fiber goal is _________________ g.  Take vitamin and mineral supplements as told by your health care provider or dietitian. Chewable or liquid forms are best when on this eating plan. Reading food labels  Check food labels for the amount of dietary fiber.  Choose foods that have less than 2 grams of fiber in one serving. Cooking  Use white flour and other allowed grains for baking and cooking.  Cook meat using methods that keep it tender, such as braising or poaching.  Cook eggs until the yolk is completely solid.  Cook with healthy oils, such as olive oil or canola oil. Meal planning   Eat 5-6 small meals  throughout the day instead of 3 large meals.  If you are lactose intolerant: ? Choose low-lactose dairy foods. ? Do not eat dairy foods, if told by your dietitian.  Limit fat and oils to less than 8 teaspoons a day.  Eat small portions of desserts. What foods are allowed? The items listed below may not be a complete list. Talk with your dietitian about what dietary choices are best for you. Grains All bread and crackers made with white flour. Waffles, pancakes, and [place] toast. Bagels. Pretzels. Melba toast, zwieback, and matzoh. Cooked and dried cereals that do not contain whole grains, added fiber, seeds, or dried fruit. CornmealDenzil Magnuson. Hot and cold cereals made with refined corn, wheat, rice, or oats. Plain pasta and noodles. White rice. Vegetables Well-cooked or canned vegetables without skin, seeds, or stems. Cooked potatoes without skins. Vegetable juice. Fruits Soft-cooked or canned fruits without skin and seeds. Peeled ripe banana. Applesauce. Fruit juice without pulp. Meats and other protein foods Ground meat. Tender cuts of meat or poultry. Eggs. Fish, seafood, and shellfish. Smooth nut butters. Tofu. Dairy All milk products and drinks. Lactose-free milks, including rice, soy, and almond milks. Yogurt without fruit, nuts, chocolate, or granola mix-ins. Sour cream. Cottage cheese. Cheese. Beverages Decaf coffee. Fruit and vegetable juices or smoothies (in small amounts, with no pulp or skins, and with fruits from allowed list). Sports drinks. Herbal tea. Fats and oils Olive oil, canola oil, sunflower oil, flaxseed oil, and grapeseed oil. Mayonnaise. Cream cheese. Margarine. Butter. Sweets and  desserts Plain cakes and cookies. Cream pies and pies made with allowed fruits. Pudding. Custard. Fruit gelatin. Sherbet. Popsicles. Ice cream without nuts. Plain hard candy. Honey. Jelly. Molasses. Syrups, including chocolate syrup. Chocolate. Marshmallows. Gumdrops. Seasoning and other  foods Bouillon. Broth. Cream soups made from allowed foods. Strained soup. Casseroles made with allowed foods. Ketchup. Mild mustard. Mild salad dressings. Plain gravies. Vinegar. Spices in moderation. Salt. Sugar. What foods are not allowed? The items listed below may not be a complete list. Talk with your dietitian about what dietary choices are best for you. Grains Whole wheat and whole grain breads and crackers. Multigrain breads and crackers. Rye bread. Whole grain or multigrain cereals. Cereals with nuts, raisins, or coconut. Bran. Coarse wheat cereals. Granola. High-fiber cereals. Cornmeal or corn bread. Whole grain pasta. Wild or brown rice. Quinoa. Popcorn. Buckwheat. Wheat germ. Vegetables Potato skins. Raw or undercooked vegetables. All beans and bean sprouts. Cooked greens. Corn. Peas. Cabbage. Beets. Broccoli. Brussels sprouts. Cauliflower. Mushrooms. Onions. Peppers. Parsnips. Okra. Sauerkraut. Fruit Raw or dried fruit. Berries. Fruit juice with pulp. Prune juice. Meats and other protein foods Tough, fibrous meats with gristle. Fatty meat. Poultry with skin. Fried meat, Environmental education officer, or fish. Deli or lunch meats. Sausage, bacon, and hot dogs. Nuts and chunky nut butter. Dried peas, beans, and lentils. Dairy Yogurt with fruit, nuts, chocolate, or granola mix-ins. Beverages Caffeinated coffee and teas. Fats and oils Avocado. Coconut. Sweets and desserts Desserts, cookies, or candies that contain nuts or coconut. Dried fruit. Jams and preserves with seeds. Marmalade. Any dessert made with fruits or grains that are not allowed. Seasoning and other foods Corn tortilla chips. Soups made with vegetables or grains that are not allowed. Relish. Horseradish. Rosita Fire. Olives. Summary  Most people on a low-fiber eating plan should eat less than 10 grams of fiber a day. Follow recommendations from your dietitian about how much fiber you should have each day.  Always check food labels to see the  dietary fiber content of packaged foods. In general, a low-fiber food will have fewer than 2 grams of fiber per serving.  In general, try to avoid whole grains, raw fruits and vegetables, dried fruit, tough cuts of meat, nuts, and seeds.  Take a vitamin and mineral supplement as told by your health care provider or dietitian. This information is not intended to replace advice given to you by your health care provider. Make sure you discuss any questions you have with your health care provider. Document Released: 06/28/2001 Document Revised: 03/11/2016 Document Reviewed: 03/11/2016 Elsevier Interactive Patient Education  2019 Elsevier Inc.    Diverticulitis  Diverticulitis is when small pockets in your large intestine (colon) get infected or swollen. This causes stomach pain and watery poop (diarrhea). These pouches are called diverticula. They form in people who have a condition called diverticulosis. Follow these instructions at home: Medicines  Take over-the-counter and prescription medicines only as told by your doctor. These include: ? Antibiotics. ? Pain medicines. ? Fiber pills. ? Probiotics. ? Stool softeners.  Do not drive or use heavy machinery while taking prescription pain medicine.  If you were prescribed an antibiotic, take it as told. Do not stop taking it even if you feel better. General instructions   Follow a diet as told by your doctor.  When you feel better, your doctor may tell you to change your diet. You may need to eat a lot of fiber. Fiber makes it easier to poop (have bowel movements). Healthy foods with fiber include: ?  Berries. ? Beans. ? Lentils. ? Green vegetables.  Exercise 3 or more times a week. Aim for 30 minutes each time. Exercise enough to sweat and make your heart beat faster.  Keep all follow-up visits as told. This is important. You may need to have an exam of the large intestine. This is called a colonoscopy. Contact a doctor  if:  Your pain does not get better.  You have a hard time eating or drinking.  You are not pooping like normal. Get help right away if:  Your pain gets worse.  Your problems do not get better.  Your problems get worse very fast.  You have a fever.  You throw up (vomit) more than one time.  You have poop that is: ? Bloody. ? Black. ? Tarry. Summary  Diverticulitis is when small pockets in your large intestine (colon) get infected or swollen.  Take medicines only as told by your doctor.  Follow a diet as told by your doctor. This information is not intended to replace advice given to you by your health care provider. Make sure you discuss any questions you have with your health care provider. Document Released: 06/25/2007 Document Revised: 01/24/2016 Document Reviewed: 01/24/2016 Elsevier Interactive Patient Education  2019 [employer]

## 2018-06-04 NOTE — Plan of Care (Signed)

## 2018-06-04 NOTE — Discharge Summary (Addendum)
Physician Discharge Summary  Jerome Burke LDJ:[id number] DOB: Jun 13, 1965 DOA: 06/02/2018  PCP: No primary care provider on file.  Admit date: 06/02/2018 Discharge date: 06/04/2018  Admitted From: Home Disposition: Home  Recommendations for Outpatient Follow-up:  1. Follow up with PCP in 1 week 2. Follow up with GI in 4-6 weeks 3. Please obtain BMP/CBC in one week 4. Please follow up on the following pending results: None  Home Health: None Equipment/Devices: None  Discharge Condition: Stable CODE STATUS: Full code Diet recommendation: Regular diet   Brief/Interim Summary:  Admission HPI written by Karmen Bongo, MD   Chief Complaint:  Abdominal pain  HPI: Jerome Burke is a 53 y.o. male with no significant past medical history presenting with abdominal pain.   At the end of February, he had low GI pain, stabbing in his anus, difficulty having a BM.  6-7 hours later, he developed hives.  He went to the doctor and was given Prednisone and sent home.  After a single dose of prednisone (60 mg), the hives went away completely and he did not need additional medication.  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  2 weeks ago, he developed lower GI symptoms again.  Again, the hives developed a few days later.  This time, no shellfish.  He again took the prednisone and benadryl and the hives were resolved within a day ago.  Pain resolved and no further issues.  Once again, he started with pain Saturday AM and hives started Saturday afternoon.  He took one dose of prednisone and went to urgent care on Sunday AM.  He was given a shot "of something, a steroid most likely."  He as given an rx for prednisone.  He was given antibiotics.  Sunday late afternoon, he had a fever which lasted <12 hours.  He started with jaw pain Sunday night and radiated into his shoulders yesterday.  He thought he had lock jaw.  Pain was radiating into his wrists.  It  never went anywhere else.  The abdominal pain started to improve Monday and never resolved.  This AM, he again broke out in hives and the pain increased some.  No further fever.  The joint pain was horrible this AM but has gradually improved through the day.  He has not been tested for GC/Chl.  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  No new partners, has been with partner for 6 years.  No IV drugs, smokes marijuana periodically.  They wanted to wait to do the prostate exam to see how he responded to the antibiotics.      ED Course:   Hives starting Sunday - happened once before, attributed to shellfish allergy.  No shellfish this time.  Seen at [facility] the day of and was given Bactrim for prostate infection.  Clear UA today (small blood).  Now with rash and lower abdominal pain, joint pain, intermittent fever at home.  No WBC elevation.  Elevated CRP, ESR.  CT with diverticulitis with microperforation, abscess, splenomegaly.  Surgery consulted and requests [facility] admission.  He vapes marijuana.   Hospital course:  Abscess of sigmoid colon Acute diverticulitis 2.4 cm abscess with microperforation. General surgery consulted. Started on Zosyn IV. Improved symptomatically with no fevers or elevated WBC. Transitioned to Augmentin with recommendations for outpatient follow-up with general surgery and eventual colonoscopy with GI in 4-6 weeks. Discharged on initially on Augmentin to complete a 10 day total course but had  some hives develop from unknown time. Unlikely from Augmentin as he was fine on Zosyn, but possible he could have an allergic reaction to the clavulanic acid aspect. No evidence of anaphylaxis. No symptoms. Switched from Augmentin to Cefdinir and Flagyl to complete a 10 day total antibiotic course.   Rash Initially thought secondary to Bactrim, but rash developed prior to Bactrim, per patient. Unknown etiology. Improved.  Jaw pain Improved. Unknown etiology. No concern for  abscess/infection.  Discharge Diagnoses:  Principal Problem:   Abscess of sigmoid colon due to diverticulitis Active Problems:   Genital HSV   Rash and nonspecific skin eruption   Jaw pain, non-TMJ    Discharge Instructions   Allergies as of 06/04/2018   No Active Allergies     Medication List    STOP taking these medications   hydrOXYzine 25 MG tablet Commonly known as:  ATARAX/VISTARIL   predniSONE 20 MG tablet Commonly known as:  DELTASONE     TAKE these medications   acetaminophen 500 MG tablet Commonly known as:  TYLENOL Take 1,000 mg by mouth every 6 (six) hours as needed for mild pain.   cefdinir 300 MG capsule Commonly known as:  OMNICEF Take 1 capsule (300 mg total) by mouth 2 (two) times daily for 8 days.   diphenhydrAMINE 25 MG tablet Commonly known as:  BENADRYL Take 50 mg by mouth every 6 (six) hours as needed for itching.   ibuprofen 200 MG tablet Commonly known as:  ADVIL Take 400 mg by mouth every 6 (six) hours as needed for mild pain.   metroNIDAZOLE 500 MG tablet Commonly known as:  FLAGYL Take 1 tablet (500 mg total) by mouth 3 (three) times daily for 8 days.      Follow-up Information    Ileana Roup, MD. Call.   Specialty:  General Surgery Why:  call as needed, you do not have to schedule an appointment Contact information: [place] [address] [phone]        [facility]. Call.   Why:  call to establish a primary care physician. they will help you arrange for a colonoscopy in 6-8 weeks Contact information: [place] [address] [phone]       [facility] Follow up.   Specialty:  Gastroenterology Contact information: [place] [address] [phone]       [facility] Follow up.   Contact information: [place]  [address] [phone]          No Active Allergies  Consultations:  General surgery   Procedures/Studies: Ct Abdomen Pelvis W Contrast  Result Date: 06/02/2018 CLINICAL DATA:  53 year old with lower abdominal pain EXAM: CT ABDOMEN AND PELVIS WITH CONTRAST TECHNIQUE: Multidetector CT imaging of the abdomen and pelvis was performed using the standard protocol following bolus administration of intravenous contrast. CONTRAST:  15m OMNIPAQUE IOHEXOL 300 MG/ML  SOLN COMPARISON:  No prior CT FINDINGS: Lower chest: No acute finding of the lower chest Hepatobiliary: Unremarkable liver.  Unremarkable gallbladder. Pancreas: Unremarkable pancreas Spleen: Cranial caudal span of the spleen measures greater than 14 cm. Adrenals/Urinary Tract: Unremarkable appearance of the adrenal glands. No evidence of hydronephrosis of the right or left kidney. No nephrolithiasis. Unremarkable course of the bilateral ureters. Unremarkable appearance of the urinary bladder. Stomach/Bowel: Unremarkable stomach. Unremarkable small bowel. No abnormal distention. No transition point. Normal appendix. Mild to moderate stool burden. Inflammatory  changes of the sigmoid colon in a region of diverticular change. Circumferential wall thickening. Edema of the fat of the mesocolon. There is a small fluid and gas collection measuring 2.4 cm. Thickening of the fascial planes. Vascular/Lymphatic: Mild atherosclerotic changes of the abdominal aorta. Bilateral iliac arteries and proximal femoral arteries are patent. No adenopathy. Reproductive: Minimal calcifications of the prostate. Other: None Musculoskeletal: No acute displaced fracture. Bilateral L5 pars defect with grade 1 anterolisthesis. IMPRESSION: Acute sigmoid diverticulitis complicated by micro perforation and a 2.4 cm abscess. Splenomegaly of uncertain etiology. Aortic Atherosclerosis (ICD10-I70.0). Bilateral L5 pars defect with grade 1 anterolisthesis Electronically Signed   By:  Corrie Mckusick D.O.   On: 06/02/2018 13:36      Subjective: No abdominal pain, nausea, vomiting. Afebrile overnight.  Discharge Exam: Vitals:   06/03/18 1445 06/03/18 1936  BP: 132/85 130/85  Pulse: 74 83  Resp:  18  Temp: 98.4 F (36.9 C) 99.7 F (37.6 C)  SpO2: 100% 97%   Vitals:   06/02/18 2033 06/03/18 0437 06/03/18 1445 06/03/18 1936  BP: 120/77 121/70 132/85 130/85  Pulse: 82 71 74 83  Resp: '18 18  18  ' Temp:  98 F (36.7 C) 98.4 F (36.9 C) 99.7 F (37.6 C)  TempSrc: Oral Oral Oral Axillary  SpO2: 98% 97% 100% 97%    General: Pt is alert, awake, not in acute distress Cardiovascular: RRR, S1/S2 +, no rubs, no gallops Respiratory: CTA bilaterally, no wheezing, no rhonchi Abdominal: Soft, NT, ND, bowel sounds + Extremities: no edema, no cyanosis    The results of significant diagnostics from this hospitalization (including imaging, microbiology, ancillary and laboratory) are listed below for reference.     Microbiology: Recent Results (from the past 240 hour(s))  SARS Coronavirus 2 (CEPHEID - Performed in [place] hospital lab), Hosp Order     Status: None   Collection Time: 06/02/18  3:19 PM  Result Value Ref Range Status   SARS Coronavirus 2 NEGATIVE NEGATIVE Final    Comment: (NOTE) If result is NEGATIVE SARS-CoV-2 target nucleic acids are NOT DETECTED. The SARS-CoV-2 RNA is generally detectable in upper and lower  respiratory specimens during the acute phase of infection. The lowest  concentration of SARS-CoV-2 viral copies this assay can detect is 250  copies / mL. A negative result does not preclude SARS-CoV-2 infection  and should not be used as the sole basis for treatment or other  patient management decisions.  A negative result may occur with  improper specimen collection / handling, submission of specimen other  than nasopharyngeal swab, presence of viral mutation(s) within the  areas targeted by this assay, and inadequate number of viral  copies  (<250 copies / mL). A negative result must be combined with clinical  observations, patient history, and epidemiological information. If result is POSITIVE SARS-CoV-2 target nucleic acids are DETECTED. The SARS-CoV-2 RNA is generally detectable in upper and lower  respiratory specimens dur ing the acute phase of infection.  Positive  results are indicative of active infection with SARS-CoV-2.  Clinical  correlation with patient history and other diagnostic information is  necessary to determine patient infection status.  Positive results do  not rule out bacterial infection or co-infection with other viruses. If result is PRESUMPTIVE POSTIVE SARS-CoV-2 nucleic acids MAY BE PRESENT.   A presumptive positive result was obtained on the submitted specimen  and confirmed on repeat testing.  While 2019 novel coronavirus  (SARS-CoV-2) nucleic acids may be present in the submitted  sample  additional confirmatory testing may be necessary for epidemiological  and / or clinical management purposes  to differentiate between  SARS-CoV-2 and other Sarbecovirus currently known to infect humans.  If clinically indicated additional testing with an alternate test  methodology [phone]) is advised. The SARS-CoV-2 RNA is generally  detectable in upper and lower respiratory sp ecimens during the acute  phase of infection. The expected result is Negative. Fact Sheet for Patients:  StrictlyIdeas.no Fact Sheet for Healthcare Providers: BankingDealers.co.za This test is not yet approved or cleared by the [place] FDA and has been authorized for detection and/or diagnosis of SARS-CoV-2 by FDA under an Emergency Use Authorization (EUA).  This EUA will remain in effect (meaning this test can be used) for the duration of the COVID-19 declaration under Section 564(b)(1) of the Act, 21 U.S.C. section 360bbb-3(b)(1), unless the authorization is terminated  or revoked sooner. Performed at [facility], [place] [address] [place], [place] [address]      Labs: BNP (last 3 results) No results for input(s): BNP in the last 8760 hours. Basic Metabolic Panel: Recent Labs  Lab 06/02/18 0952 06/03/18 0319  NA 134* 138  K 3.6 4.2  CL 100 105  CO2 23 23  GLUCOSE 94 75  BUN 13 14  CREATININE 1.06 1.04  CALCIUM 9.0 8.5*   Liver Function Tests: Recent Labs  Lab 06/02/18 0952  AST 18  ALT 16  ALKPHOS 52  BILITOT 1.1  PROT 6.4*  ALBUMIN 3.2*   Recent Labs  Lab 06/02/18 0952  LIPASE 37   No results for input(s): AMMONIA in the last 168 hours. CBC: Recent Labs  Lab 06/02/18 0952 06/03/18 0319  WBC 8.6 7.3  NEUTROABS 6.6  --   HGB 14.9 12.3*  HCT 42.7 35.3*  MCV 90.5 89.4  PLT 208 190   Cardiac Enzymes: No results for input(s): CKTOTAL, CKMB, CKMBINDEX, TROPONINI in the last 168 hours. BNP: Invalid input(s): POCBNP CBG: No results for input(s): GLUCAP in the last 168 hours. D-Dimer No results for input(s): DDIMER in the last 72 hours. Hgb A1c No results for input(s): HGBA1C in the last 72 hours. Lipid Profile No results for input(s): CHOL, HDL, LDLCALC, TRIG, CHOLHDL, LDLDIRECT in the last 72 hours. Thyroid function studies No results for input(s): TSH, T4TOTAL, T3FREE, THYROIDAB in the last 72 hours.  Invalid input(s): FREET3 Anemia work up No results for input(s): VITAMINB12, FOLATE, FERRITIN, TIBC, IRON, RETICCTPCT in the last 72 hours. Urinalysis    Component Value Date/Time   COLORURINE YELLOW 06/02/2018 1034   APPEARANCEUR CLEAR 06/02/2018 1034   LABSPEC <1.005 (L) 06/02/2018 1034   PHURINE 6.5 06/02/2018 1034   GLUCOSEU NEGATIVE 06/02/2018 1034   HGBUR SMALL (A) 06/02/2018 1034   BILIRUBINUR NEGATIVE 06/02/2018 1034   KETONESUR NEGATIVE 06/02/2018 1034   PROTEINUR NEGATIVE 06/02/2018 1034   NITRITE NEGATIVE 06/02/2018 1034   LEUKOCYTESUR NEGATIVE 06/02/2018 1034   Sepsis Labs Invalid  input(s): PROCALCITONIN,  WBC,  LACTICIDVEN Microbiology Recent Results (from the past 240 hour(s))  SARS Coronavirus 2 (CEPHEID - Performed in [place] hospital lab), Hosp Order     Status: None   Collection Time: 06/02/18  3:19 PM  Result Value Ref Range Status   SARS Coronavirus 2 NEGATIVE NEGATIVE Final    Comment: (NOTE) If result is NEGATIVE SARS-CoV-2 target nucleic acids are NOT DETECTED. The SARS-CoV-2 RNA is generally detectable in upper and lower  respiratory specimens during the acute phase of infection. The  lowest  concentration of SARS-CoV-2 viral copies this assay can detect is 250  copies / mL. A negative result does not preclude SARS-CoV-2 infection  and should not be used as the sole basis for treatment or other  patient management decisions.  A negative result may occur with  improper specimen collection / handling, submission of specimen other  than nasopharyngeal swab, presence of viral mutation(s) within the  areas targeted by this assay, and inadequate number of viral copies  (<250 copies / mL). A negative result must be combined with clinical  observations, patient history, and epidemiological information. If result is POSITIVE SARS-CoV-2 target nucleic acids are DETECTED. The SARS-CoV-2 RNA is generally detectable in upper and lower  respiratory specimens dur ing the acute phase of infection.  Positive  results are indicative of active infection with SARS-CoV-2.  Clinical  correlation with patient history and other diagnostic information is  necessary to determine patient infection status.  Positive results do  not rule out bacterial infection or co-infection with other viruses. If result is PRESUMPTIVE POSTIVE SARS-CoV-2 nucleic acids MAY BE PRESENT.   A presumptive positive result was obtained on the submitted specimen  and confirmed on repeat testing.  While 2019 novel coronavirus  (SARS-CoV-2) nucleic acids may be present in the submitted sample   additional confirmatory testing may be necessary for epidemiological  and / or clinical management purposes  to differentiate between  SARS-CoV-2 and other Sarbecovirus currently known to infect humans.  If clinically indicated additional testing with an alternate test  methodology [phone]) is advised. The SARS-CoV-2 RNA is generally  detectable in upper and lower respiratory sp ecimens during the acute  phase of infection. The expected result is Negative. Fact Sheet for Patients:  StrictlyIdeas.no Fact Sheet for Healthcare Providers: BankingDealers.co.za This test is not yet approved or cleared by the [place] FDA and has been authorized for detection and/or diagnosis of SARS-CoV-2 by FDA under an Emergency Use Authorization (EUA).  This EUA will remain in effect (meaning this test can be used) for the duration of the COVID-19 declaration under Section 564(b)(1) of the Act, 21 U.S.C. section 360bbb-3(b)(1), unless the authorization is terminated or revoked sooner. Performed at [facility], [place] [address] [place], [place] [address]     SIGNED:   Cordelia Poche, MD [facility] 06/04/2018, 12:44 PM

## 2018-06-04 NOTE — Progress Notes (Signed)
[facility] Progress Note     Subjective: CC-  Continues to feel better each day. Rectal pain/pressure and LLQ/suprapubic pain improving. Tolerating clear liquids without n/v or increased pain. He reports having a few loose BMs yesterday. Afebrile.  Objective: Vital signs in last 24 hours: Temp:  [98.4 F (36.9 C)-99.7 F (37.6 C)] 99.7 F (37.6 C) (05/14 1936) Pulse Rate:  [74-83] 83 (05/14 1936) Resp:  [18] 18 (05/14 1936) BP: (130-132)/(85) 130/85 (05/14 1936) SpO2:  [97 %-100 %] 97 % (05/14 1936) Last BM Date: 06/03/18  Intake/Output from previous day: 05/14 0701 - 05/15 0700 In: 4329.2 [P.O.:1400; I.V.:2679.2; IV Piggyback:250] Out: -  Intake/Output this shift: No intake/output data recorded.  PE: Gen:  Alert, NAD, pleasant HEENT: EOM's intact, pupils equal and round Pulm:  effort normal Abd: Soft, ND, +BS, no HSM, subjective LLQ TTP without rebound or guarding Skin: no rashes noted, warm and dry   Lab Results:  Recent Labs    06/02/18 0952 06/03/18 0319  WBC 8.6 7.3  HGB 14.9 12.3*  HCT 42.7 35.3*  PLT 208 190   BMET Recent Labs    06/02/18 0952 06/03/18 0319  NA 134* 138  K 3.6 4.2  CL 100 105  CO2 23 23  GLUCOSE 94 75  BUN 13 14  CREATININE 1.06 1.04  CALCIUM 9.0 8.5*   PT/INR No results for input(s): LABPROT, INR in the last 72 hours. CMP     Component Value Date/Time   NA 138 06/03/2018 0319   K 4.2 06/03/2018 0319   CL 105 06/03/2018 0319   CO2 23 06/03/2018 0319   GLUCOSE 75 06/03/2018 0319   BUN 14 06/03/2018 0319   CREATININE 1.04 06/03/2018 0319   CALCIUM 8.5 (L) 06/03/2018 0319   PROT 6.4 (L) 06/02/2018 0952   ALBUMIN 3.2 (L) 06/02/2018 0952   AST 18 06/02/2018 0952   ALT 16 06/02/2018 0952   ALKPHOS 52 06/02/2018 0952   BILITOT 1.1 06/02/2018 0952   GFRNONAA >60 06/03/2018 0319   GFRAA >60 06/03/2018 0319   Lipase     Component Value Date/Time   LIPASE 37 06/02/2018 0952       Studies/Results: Ct  Abdomen Pelvis W Contrast  Result Date: 06/02/2018 CLINICAL DATA:  53 year old with lower abdominal pain EXAM: CT ABDOMEN AND PELVIS WITH CONTRAST TECHNIQUE: Multidetector CT imaging of the abdomen and pelvis was performed using the standard protocol following bolus administration of intravenous contrast. CONTRAST:  OMNIPAQUE IOHEXOL 300 MG/ML  SOLN COMPARISON:  No prior CT FINDINGS: Lower chest: No acute finding of the lower chest Hepatobiliary: Unremarkable liver.  Unremarkable gallbladder. Pancreas: Unremarkable pancreas Spleen: Cranial caudal span of the spleen measures greater than 14 cm. Adrenals/Urinary Tract: Unremarkable appearance of the adrenal glands. No evidence of hydronephrosis of the right or left kidney. No nephrolithiasis. Unremarkable course of the bilateral ureters. Unremarkable appearance of the urinary bladder. Stomach/Bowel: Unremarkable stomach. Unremarkable small bowel. No abnormal distention. No transition point. Normal appendix. Mild to moderate stool burden. Inflammatory changes of the sigmoid colon in a region of diverticular change. Circumferential wall thickening. Edema of the fat of the mesocolon. There is a small fluid and gas collection measuring 2.4 cm. Thickening of the fascial planes. Vascular/Lymphatic: Mild atherosclerotic changes of the abdominal aorta. Bilateral iliac arteries and proximal femoral arteries are patent. No adenopathy. Reproductive: Minimal calcifications of the prostate. Other: None Musculoskeletal: No acute displaced fracture. Bilateral L5 pars defect with grade 1 anterolisthesis. IMPRESSION: Acute sigmoid  diverticulitis complicated by micro perforation and a 2.4 cm abscess. Splenomegaly of uncertain etiology. Aortic Atherosclerosis (ICD10-I70.0). Bilateral L5 pars defect with grade 1 anterolisthesis Electronically Signed   By: Gilmer MorJaime  Wagner D.O.   On: 06/02/2018 13:36    Anti-infectives: Anti-infectives (From admission, onward)   Start      Dose/Rate Route Frequency Ordered Stop   06/04/18 1000  amoxicillin-clavulanate (AUGMENTIN) 875-125 MG per tablet 1 tablet     1 tablet Oral Every 12 hours 06/04/18 0813     06/02/18 1500  piperacillin-tazobactam (ZOSYN) IVPB 3.375 g  Status:  Discontinued     3.375 g 12.5 mL/hr over 240 Minutes Intravenous Every 8 hours 06/02/18 1446 06/04/18 0813       Assessment/Plan Hives/joint pain - workup per primary, presumed drug reaction to Bactrim Splenomegaly  Sigmoid diverticulitis with microperforation and 2.4cm abscess - never had a colonoscopy before - CT scan 5/13 as above - Will need colonoscopy 6-8 wks out from resolution of this attack  - abdominal pain improving, VSS, tolerating clears  ID -zosyn 5/13>>5/15, augmentin 5/15>> VTE -SCDs, ok for chemical DVT prophylaxis from surgical standpoint FEN -IVF, CLD Foley -none Follow up -TBD  Plan: Advance to regular/vegetarian diet. Transition to oral antibiotics (augmentin); patient needs to complete a 10-14 day course. Discussed continuing low fiber diet for 2-3 weeks, then resume high fiber diet. Patient knows that he will need to have a colonoscopy in 6-8 weeks after resolution of this attack. He does not need to follow up with general surgery. If tolerating diet and oral antibiotics, patient may be ready for discharge this afternoon versus tomorrow.   LOS: 2 days    Franne FortsBrooke A  , [facility] 06/04/2018, 8:16 AM Pager: [phone] Mon-Thurs 7:00 am-4:30 pm Fri 7:00 am -11:30 AM Sat-Sun 7:00 am-11:30 am

## 2018-06-04 NOTE — Progress Notes (Signed)
[name] to be D/C'd  per MD order. Discussed with the patient and all questions fully answered.  VSS, Skin clean, dry and intact without evidence of skin break down, no evidence of skin tears noted.  IV catheter discontinued intact. Site without signs and symptoms of complications. Dressing and pressure applied.  An After Visit Summary was printed and given to the patient. Patient received prescription.  D/c education completed with patient/family including follow up instructions, medication list, d/c activities limitations if indicated, with other d/c instructions as indicated by MD - patient able to verbalize understanding, all questions fully answered.   Patient instructed to return to ED, call 911, or call MD for any changes in condition.   Patient to be escorted via WC, and D/C home via private auto.

## 2018-06-04 NOTE — Plan of Care (Signed)
  Problem: Clinical Measurements: Goal: Ability to maintain clinical measurements within normal limits will improve Outcome: Progressing Goal: Will remain free from infection Outcome: Progressing Goal: Cardiovascular complication will be avoided Outcome: Progressing   Problem: Coping: Goal: Level of anxiety will decrease Outcome: Progressing   Problem: Pain Managment: Goal: General experience of comfort will improve Outcome: Progressing

## 2019-04-28 ENCOUNTER — Ambulatory Visit: Payer: BLUE CROSS/BLUE SHIELD | Attending: Internal Medicine

## 2019-04-28 DIAGNOSIS — Z23 Encounter for immunization: Secondary | ICD-10-CM

## 2019-04-28 NOTE — Progress Notes (Signed)
   Covid-19 Vaccination Clinic  Name:  Victoria Euceda    MRN: [id number] DOB: [birth year]  04/28/2019  Mr. Norwood was observed post Covid-19 immunization for 15 minutes without incident. He was provided with Vaccine Information Sheet and instruction to access the V-Safe system.   Mr. Wirick was instructed to call 911 with any severe reactions post vaccine: Marland Kitchen Difficulty breathing  . Swelling of face and throat  . A fast heartbeat  . A bad rash all over body  . Dizziness and weakness   Immunizations Administered    Name Date Dose VIS Date Route   Pfizer COVID-19 Vaccine 04/28/2019  4:36 PM 0.3 mL 12/31/2018 Intramuscular   Manufacturer: [employer], [employer]   Lot: [id number]   NDC: [address]-3

## 2020-03-20 ENCOUNTER — Other Ambulatory Visit: Payer: Self-pay

## 2020-03-20 ENCOUNTER — Emergency Department
Admission: EM | Admit: 2020-03-20 | Discharge: 2020-03-20 | Disposition: A | Discharge status: Home / Self Care | Payer: BLUE CROSS/BLUE SHIELD | Source: Home / Self Care

## 2020-03-20 ENCOUNTER — Emergency Department (INDEPENDENT_AMBULATORY_CARE_PROVIDER_SITE_OTHER): Payer: 59

## 2020-03-20 DIAGNOSIS — R21 Rash and other nonspecific skin eruption: Secondary | ICD-10-CM | POA: Diagnosis not present

## 2020-03-20 DIAGNOSIS — J440 Chronic obstructive pulmonary disease with acute lower respiratory infection: Secondary | ICD-10-CM

## 2020-03-20 DIAGNOSIS — R509 Fever, unspecified: Secondary | ICD-10-CM | POA: Diagnosis not present

## 2020-03-20 DIAGNOSIS — R059 Cough, unspecified: Secondary | ICD-10-CM

## 2020-03-20 DIAGNOSIS — R0789 Other chest pain: Secondary | ICD-10-CM

## 2020-03-20 MED ORDER — PREDNISONE 20 MG PO TABS: 40.0000 mg | ORAL_TABLET | Freq: Every day | ORAL | 0 refills | Status: DC

## 2020-03-20 MED ORDER — TRIAMCINOLONE ACETONIDE 0.025 % EX OINT: 1.0000 "application " | TOPICAL_OINTMENT | Freq: Two times a day (BID) | CUTANEOUS | 0 refills | Status: AC

## 2020-03-20 MED ORDER — ALBUTEROL SULFATE HFA 108 (90 BASE) MCG/ACT IN AERS
2.0000 | INHALATION_SPRAY | Freq: Four times a day (QID) | RESPIRATORY_TRACT | Status: DC | PRN
  Administered 2020-03-20: 2 via RESPIRATORY_TRACT

## 2020-03-20 MED ORDER — METHYLPREDNISOLONE SODIUM SUCC 125 MG IJ SOLR
125.0000 mg | Freq: Once | INTRAMUSCULAR | Status: AC
  Administered 2020-03-20: 125 mg via INTRAMUSCULAR

## 2020-03-20 MED ORDER — DOXYCYCLINE HYCLATE 100 MG PO CAPS: 100.0000 mg | ORAL_CAPSULE | Freq: Two times a day (BID) | ORAL | 0 refills | Status: AC

## 2020-03-20 NOTE — ED Provider Notes (Signed)
[facility]    CSN: [id number] Arrival date & time: 03/20/20  1012      History   Chief Complaint Chief Complaint  Patient presents with  . Generalized Body Aches  . Cough    HPI Jerome Burke is a 55 y.o. male.   HPI Patient presents today for evaluation of 3 days of  fever, cough, chills, chest  tightness and back pain. History of COVID 3-4 months ago which he endorses he experience secondary upper respiratory infection and required hospital admission during his initial Covid diagnosis. He also experienced a itchy similar pattern rash during his last episode of COVID and also with an infection of diverticulitis. He denies any known sick contacts. He endorses chills and feeling fatigued along with back pain. Endorses feeling that his lungs are painful. He has not taken any medication for fever today. Highest fever has been greater than 101 which was last measured yesterday. He is able to drink and eat although endorses poor appetite and lack of energy. Past Medical History:  Diagnosis Date  . HSV-1 (herpes simplex virus 1) infection     Patient Active Problem List   Diagnosis Date Noted  . 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 Abscess of sigmoid colon due to diverticulitis 06/02/2018  . Rash and nonspecific skin eruption 06/02/2018  . Jaw pain, non-TMJ 06/02/2018    Past Surgical History:  Procedure Laterality Date  . VASECTOMY         Home Medications    Prior to Admission medications   Medication Sig Start Date End Date Taking? Authorizing Provider  acetaminophen (TYLENOL) 500 MG tablet Take 1,000 mg by mouth every 6 (six) hours as needed for mild pain.    [provider]  diphenhydrAMINE (BENADRYL) 25 MG tablet Take 50 mg by mouth every 6 (six) hours as needed for itching.     [provider]  ibuprofen (ADVIL,MOTRIN) 200 MG tablet Take 400 mg by mouth every 6 (six) hours as needed for mild pain.    [provider]    Family  History Family History  Problem Relation Age of Onset  . Hypertension Mother   . Hypertension Father     Social History Social History   Tobacco Use  . Smoking status: Current Every Day Smoker    Types: E-cigarettes  . Smokeless tobacco: Never Used  . Tobacco comment: vapes daily  Vaping Use  . Vaping Use: Every day  Substance Use Topics  . Alcohol use: Yes    Comment: occ  . Drug use: Yes    Types: Marijuana    Comment: occasional     Allergies   Patient has no known allergies.   Review of Systems Review of Systems Pertinent negatives listed in HPI   Physical Exam Triage Vital Signs ED Triage Vitals  Enc Vitals Group     BP 03/20/20 1028 105/63     Pulse Rate 03/20/20 1028 97     Resp 03/20/20 1028 16     Temp 03/20/20 1028 99.3 F (37.4 C)     Temp Source 03/20/20 1028 Oral     SpO2 03/20/20 1028 99 %     Weight --      Height --      Head Circumference --      Peak Flow --      Pain Score 03/20/20 1026 3     Pain Loc --      Pain Edu? --  Excl. in GC? --    No data found.  Updated Vital Signs BP 105/63 (BP Location: Left Arm)   Pulse 97   Temp 99.3 F (37.4 C) (Oral)   Resp 16   SpO2 99%   Visual Acuity Right Eye Distance:   Left Eye Distance:   Bilateral Distance:    Right Eye Near:   Left Eye Near:    Bilateral Near:     Physical Exam General appearance: alert,  acutely ill-appearing, no distress Head: Normocephalic, without obvious abnormality, atraumatic ENT: External ears normal,+congestion, oropharynx patent  Respiratory: Respirations even , unlabored, coarse lung sound, expiratory wheeze Heart: rate and rhythm normal. No gallop or murmurs noted on exam  Abdomen: BS +, no distention, no rebound tenderness, or no mass Extremities: No gross deformities Skin: Skin color, texture, turgor normal. No rashes seen  Psych: Appropriate mood and affect. Neurologic: GCS 15, normal gait and  normal coordination  UC Treatments /  Results  Labs (all labs ordered are listed, but only abnormal results are displayed) Labs Reviewed  COVID-19, FLU A+B NAA    EKG   Radiology No results found.  Procedures Procedures (including critical care time)  Medications Ordered in UC Medications  methylPREDNISolone sodium succinate (SOLU-MEDROL) 125 mg/2 mL injection 125 mg (has no administration in time range)    Initial Impression / Assessment and Plan / UC Course  I have reviewed the triage vital signs and the nursing notes.  Pertinent labs & imaging results that were available during my care of the patient were reviewed by me and considered in my medical decision making (see chart for details).   Covid and flu test pending. Chest x-ray obtained today given patient endorsing chest tightness and mid back pain to rule out acute pneumonia chest x-ray revealed chronic changes consistent with those of COPD. Patient has a history of cigarette smoking and is an active daily smoker of vapes and has a documented history of marijuana usage which is likely contributory to chronic changes noted on chest x-ray. Patient advised of finding. Encouraged to discontinue vaping. Also provided follow-up information for evaluation by pulmonology giving abnormal findings on chest x-ray. Will cover today with doxycycline to cover for any underlying bacterial etiology of lung infection, fever continue management with Tylenol, for management of cough and chest tightness albuterol inhaler dispensed here in clinic with instructions of use performed by RN, I have also covered with prednisone to reduce inflammation in chest which is likely causing chest tightness. Patient provided a work note to remain out of work until Friday may require longer absence if fever persist. ER precautions given if any difficulty in breathing worsens. Patient stable and ambulatory for discharge. Final Clinical Impressions(s) / UC Diagnoses   Final diagnoses:  Rash and  nonspecific skin eruption  Cough  Fever, unspecified  COPD with lower respiratory infection [facility])     Discharge Instructions     Your COVID 19 / Flu test results should result within 3-5 days. Positive results are immediately resulted to Mychart. Tylenol  as needed for body aches and fever.  Symptom management per recommendations discussed today.  If any breathing difficulty or chest pain develops go immediately to the closest emergency department for evaluation.     ED Prescriptions    Medication Sig Dispense Auth. Provider   predniSONE (DELTASONE) 20 MG tablet Take 2 tablets (40 mg total) by mouth daily with breakfast. 10 tablet Bing Neighbors, FNP   triamcinolone (KENALOG) 0.025 % ointment  Apply 1 application topically 2 (two) times daily. 30 g Bing Neighbors, FNP   doxycycline (VIBRAMYCIN) 100 MG capsule Take 1 capsule (100 mg total) by mouth 2 (two) times daily for 7 days. 14 capsule Bing Neighbors, FNP     PDMP not reviewed this encounter.   Bing Neighbors, FNP 03/20/20 1255

## 2020-03-20 NOTE — Discharge Instructions (Signed)
Your COVID 19 / Flu test results should result within 3-5 days. Positive results are immediately resulted to Mychart. Tylenol  as needed for body aches and fever.  Symptom management per recommendations discussed today.  If any breathing difficulty or chest pain develops go immediately to the closest emergency department for evaluation.

## 2020-03-20 NOTE — ED Triage Notes (Addendum)
Patient presents to Urgent Care with complaints of generalized body aches, cough, fever, and congestion since two days ago. Patient reports he has back pain "from his lungs" when he wakes up in the morning, thinks he has the flu.  Pt also has generalized itchy rash. Had the same rash when he had covid just over 3 months ago.

## 2020-03-21 LAB — COVID-19, FLU A+B NAA
Influenza A, NAA: DETECTED — AB
Influenza B, NAA: NOT DETECTED
SARS-CoV-2, NAA: NOT DETECTED

## 2020-04-17 ENCOUNTER — Other Ambulatory Visit: Payer: Self-pay

## 2020-04-17 ENCOUNTER — Ambulatory Visit (INDEPENDENT_AMBULATORY_CARE_PROVIDER_SITE_OTHER): Payer: 59 | Admitting: Internal Medicine

## 2020-04-17 ENCOUNTER — Encounter: Payer: Self-pay | Admitting: Internal Medicine

## 2020-04-17 DIAGNOSIS — R0789 Other chest pain: Secondary | ICD-10-CM

## 2020-04-17 DIAGNOSIS — R053 Chronic cough: Secondary | ICD-10-CM

## 2020-04-17 DIAGNOSIS — U099 Post covid-19 condition, unspecified: Secondary | ICD-10-CM | POA: Diagnosis not present

## 2020-04-17 LAB — CBC WITH DIFFERENTIAL/PLATELET
Basophils Absolute: 0 10*3/uL (ref 0.0–0.1)
Basophils Relative: 0.5 % (ref 0.0–3.0)
Eosinophils Absolute: 0 10*3/uL (ref 0.0–0.7)
Eosinophils Relative: 1.3 % (ref 0.0–5.0)
HCT: 44 % (ref 39.0–52.0)
Hemoglobin: 15.2 g/dL (ref 13.0–17.0)
Lymphocytes Relative: 42.8 % (ref 12.0–46.0)
Lymphs Abs: 1.7 10*3/uL (ref 0.7–4.0)
MCHC: 34.5 g/dL (ref 30.0–36.0)
MCV: 91.4 fl (ref 78.0–100.0)
Monocytes Absolute: 0.3 10*3/uL (ref 0.1–1.0)
Monocytes Relative: 8.8 % (ref 3.0–12.0)
Neutro Abs: 1.8 10*3/uL (ref 1.4–7.7)
Neutrophils Relative %: 46.6 % (ref 43.0–77.0)
Platelets: 185 10*3/uL (ref 150.0–400.0)
RBC: 4.82 Mil/uL (ref 4.22–5.81)
RDW: 13 % (ref 11.5–15.5)
WBC: 3.9 10*3/uL — ABNORMAL LOW (ref 4.0–10.5)

## 2020-04-17 LAB — SEDIMENTATION RATE: Sed Rate: 2 mm/hr (ref 0–20)

## 2020-04-17 NOTE — Assessment & Plan Note (Addendum)
Onset 09/2019 s/p smoking cessation 2012  - PFTs ordered  Clinically and by cxr he may have very mild copd.  However, I reviewed the Fletcher curve with the patient that basically indicates  if you quit smoking when your best day FEV1 is still well preserved (as is almost certainly  the case here)  it is highly unlikely you will progress to severe disease and informed the patient there was  no medication on the market that has proven to alter the curve/ its downward trajectory  or the likelihood of progression of their disease(unlike other chronic medical conditions such as atheroclerosis where we do think we can change the natural hx with risk reducing meds)    Therefore stopping smoking and maintaining abstinence are  the most important aspects of care, not choice of inhalers or for that matter, doctors.   Treatment other than smoking cessation  is entirely directed by severity of symptoms and focused also on reducing exacerbations, not attempting to change the natural history of the disease.    >>> f/u pfts pending.            Each maintenance medication was reviewed in detail including emphasizing most importantly the difference between maintenance and prns and under what circumstances the prns are to be triggered using an action plan format where appropriate.  Total time for H and P, chart review, counseling, and generating customized AVS unique to this office visit / same day charting =  46 min

## 2020-04-17 NOTE — Patient Instructions (Signed)
No medications at this point  Please remember to go to the lab department   for your tests - we will call you with the results when they are available.     Return here next avialable for PFTs and office visit to follow

## 2020-04-17 NOTE — Progress Notes (Signed)
Jerome Burke, male    DOB: 12-Jun-1965    MRN: [id number]   Brief patient profile:  55 yowm quit smoking around 2012 but still vaping able to do steps all day at work then covid 09/2019 with aches, fever, cough s/p vax x 2 and no rx in [place] and w/in one week all better x R> L post cp just when wakes up better p a couple breaths does fine assoc with lingering cough ever since covid with minimal non-productive cough so referred to pulmonary clinic 04/17/2020 by Dr   Tiburcio Pea      History of Present Illness  04/17/2020  Pulmonary/ 1st office eval/Alessandra Sawdey  Chief Complaint  Patient presents with  . Pulmonary Consult    Referred by Joaquin Courts, NP.  Pt c/o "lung pain in the morning" since had covid 7 months ago. He states pain goes away when he takes a deep breath. He also has prod cough- minimal sputum and unsure of color.   Dyspnea: Not limited by breathing from desired activities   Cough: minimal dry daytime only  Sleep: fine  SABA use: none  No obvious day to day or daytime variability or assoc excess/ purulent sputum or mucus plugs or hemoptysis or cp or chest tightness, subjective wheeze or overt sinus or hb symptoms.   sleeping without nocturnal  exacerbation  of respiratory  c/o's or need for noct saba. Also denies any obvious fluctuation of symptoms with weather or environmental changes or other aggravating or alleviating factors except as outlined above   No unusual exposure hx or h/o childhood pna/ asthma or knowledge of premature birth.  Current Allergies, Complete Past Medical History, Past Surgical History, Family History, and Social History were reviewed in [employer] record.  ROS  The following are not active complaints unless bolded Hoarseness, sore throat, dysphagia, dental problems, itching, sneezing,  nasal congestion or discharge of excess mucus or purulent secretions, ear ache,   fever, chills, sweats, unintended wt loss or wt gain, classically  pleuritic or exertional cp,  orthopnea pnd or arm/hand swelling  or leg swelling, presyncope, palpitations, abdominal pain, anorexia, nausea, vomiting, diarrhea  or change in bowel habits or change in bladder habits, change in stools or change in urine, dysuria, hematuria,  rash, arthralgias, visual complaints, headache, numbness, weakness or ataxia or problems with walking or coordination,  change in mood or  memory.           Past Medical History:  Diagnosis Date  . HSV-1 (herpes simplex virus 1) infection     Outpatient Medications Prior to Visit  Medication Sig Dispense Refill  . diphenhydrAMINE (BENADRYL) 25 MG tablet Take 50 mg by mouth every 6 (six) hours as needed for itching.     . triamcinolone (KENALOG) 0.025 % ointment Apply 1 application topically 2 (two) times daily. (Patient taking differently: Apply 1 application topically 2 (two) times daily. As needed) 30 g 0       Objective:     BP 110/72 (BP Location: Left Arm, Cuff Size: Normal)   Pulse 79   Temp (!) 97.5 F (36.4 C) (Temporal)   Ht 5\' 9"  (1.753 m)   Wt 176 lb (79.8 kg)   SpO2 99% Comment: on RA  BMI 25.99 kg/m   SpO2: 99 % (on RA)   Healthy appearing amb wm nad   HEENT : pt wearing mask not removed for exam due to covid - 19 concerns.   NECK :  without JVD/Nodes/TM/  nl carotid upstrokes bilaterally   LUNGS: no acc muscle use,  Min barrel  contour chest wall with bilateral  slightly decreased bs s audible wheeze and  without cough on insp or exp maneuvers and min  Hyperresonant  to  percussion bilaterally     CV:  RRR  no s3 or murmur or increase in P2, and no edema   ABD:  soft and nontender with pos end  insp Hoover's  in the supine position. No bruits or organomegaly appreciated, bowel sounds nl  MS:   Nl gait/  ext warm without deformities, calf tenderness, cyanosis or clubbing No obvious joint restrictions   SKIN: warm and dry without lesions    NEURO:  alert, approp, nl sensorium with  no  motor or cerebellar deficits apparent.        I personally reviewed images and agree with radiology impression as follows:  CXR:   Pa and lat  03/20/20 COPD.  No active disease  Labs ordered 04/17/2020  :    alpha one AT phenotype      Lab Results  Component Value Date   ESRSEDRATE 2 04/17/2020   ESRSEDRATE 35 (H) 06/02/2018    Lab Results  Component Value Date   DDIMER 0.31 04/17/2020       Assessment   No problem-specific Assessment & Plan notes found for this encounter.     Sandrea Hughs, MD 04/17/2020

## 2020-04-17 NOTE — Assessment & Plan Note (Addendum)
Onset covid 09/2019  - esr 04/17/2020   = 2 / neg d dimer   Very atypical pattern since covid sym though slt worse on R posteriorly  and even then only 1/10 severity, always better p one deep breath, never wakes him up but always present on wakening supine until takes his first deep breath.   For now, reassurance but no further w/u pending return for pfts

## 2020-04-25 IMAGING — CT CT ABDOMEN AND PELVIS WITH CONTRAST
2 of 5 series · 16 of 46 positions shown, 18 images · IV contrast (Omni 300)
Comparison: No prior CT

CLINICAL DATA: 53-year-old with lower abdominal pain

EXAM:
CT ABDOMEN AND PELVIS WITH CONTRAST
TECHNIQUE: Multidetector CT imaging of the abdomen and pelvis was performed
using the standard protocol following bolus administration of
intravenous contrast.
CONTRAST:  100mL OMNIPAQUE IOHEXOL 300 MG/ML  SOLN

[Series 3: a/p w/ 5mm · axial · 0.79mm/px · z∈[+730,+1095]mm · 13 of 83 slices shown, 15 images]
[im 5/83  soft-tissue]
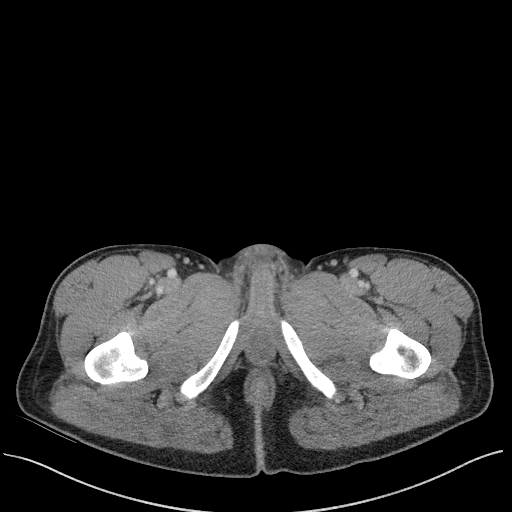
[im 5/83  bone]
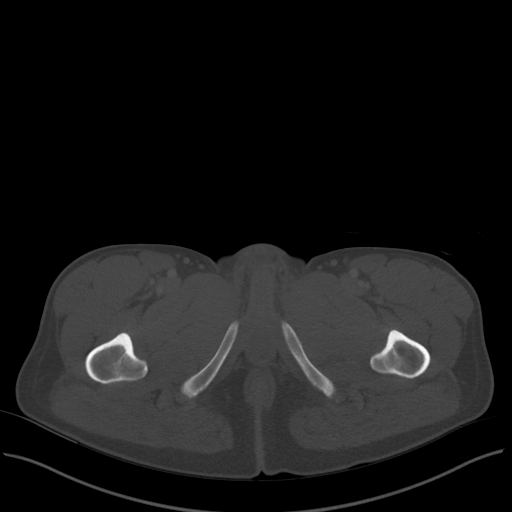
[im 10/83  soft-tissue]
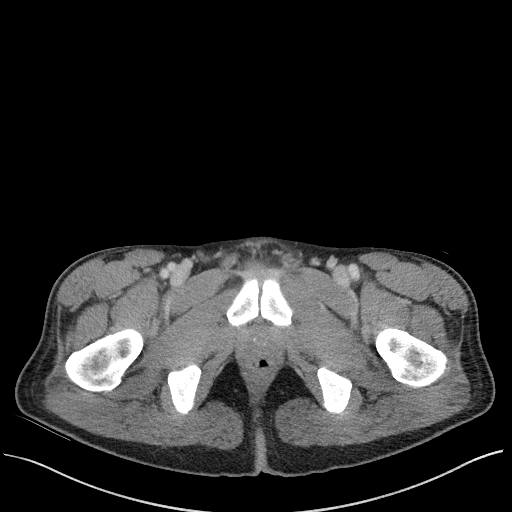
[im 19/83  soft-tissue]
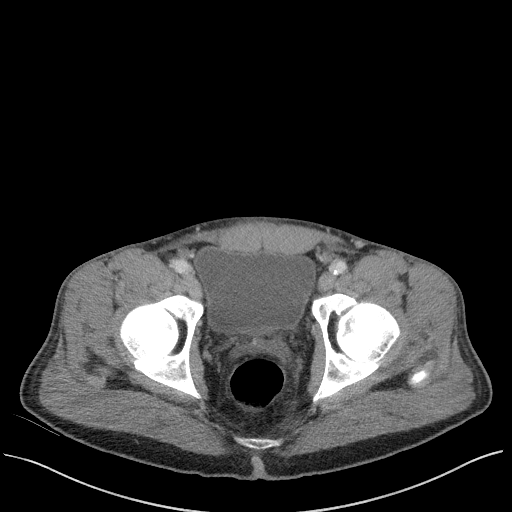
[im 23/83  soft-tissue]
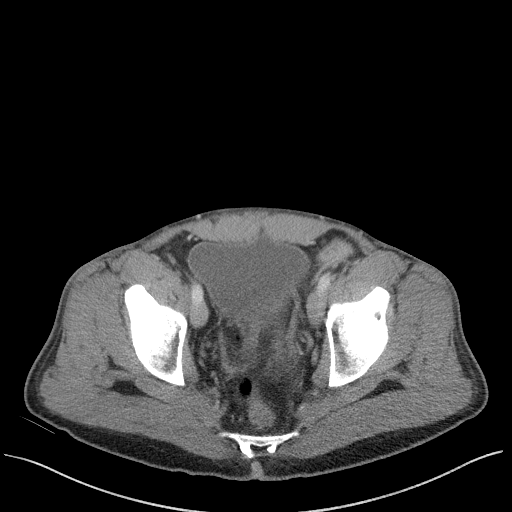
[im 28/83  soft-tissue]
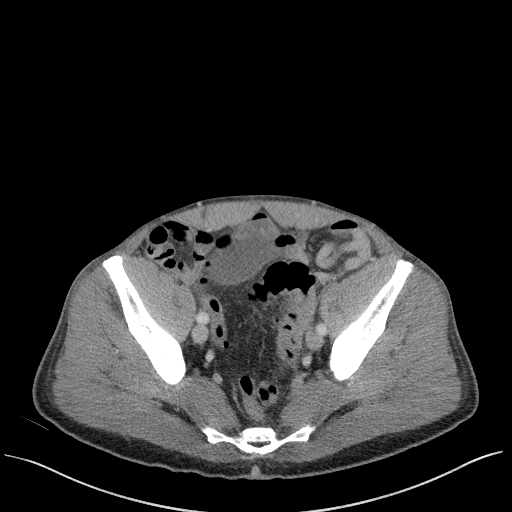
[im 37/83  soft-tissue]
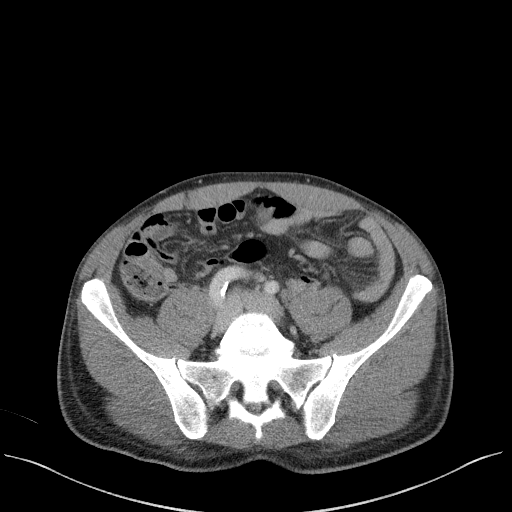
[im 42/83  soft-tissue]
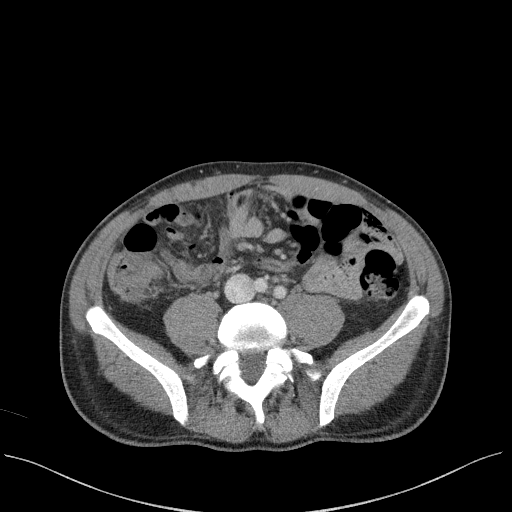
[im 46/83  soft-tissue]
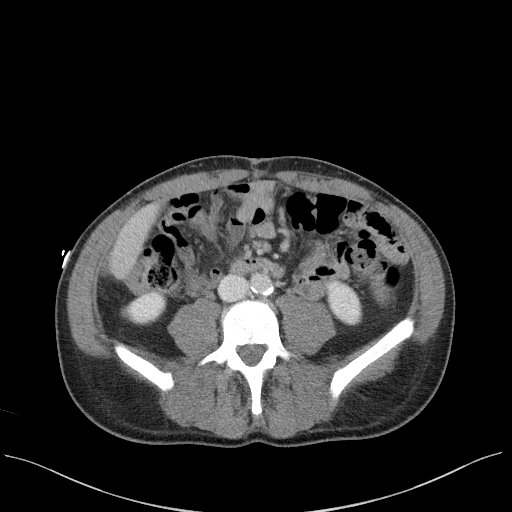
[im 55/83  soft-tissue]
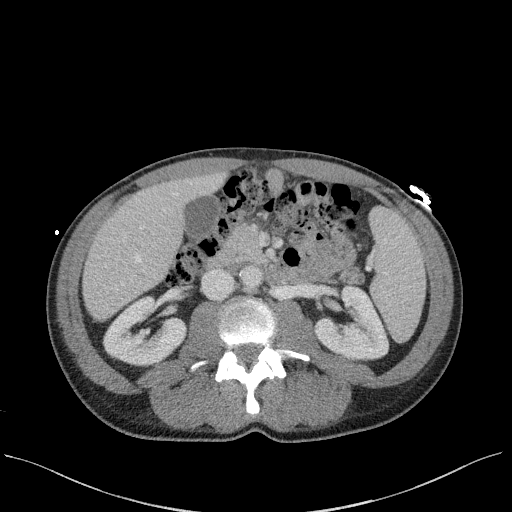
[im 55/83  bone]
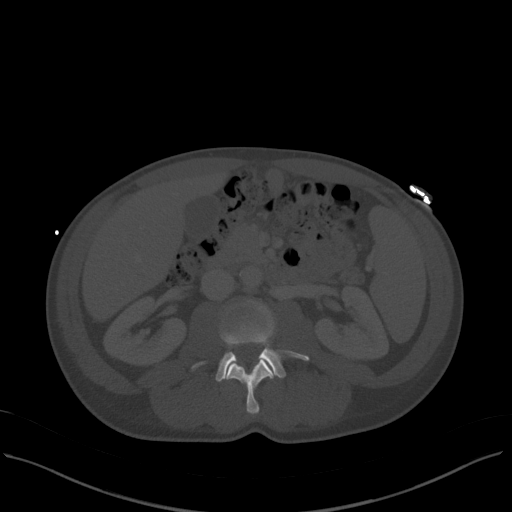
[im 60/83  soft-tissue]
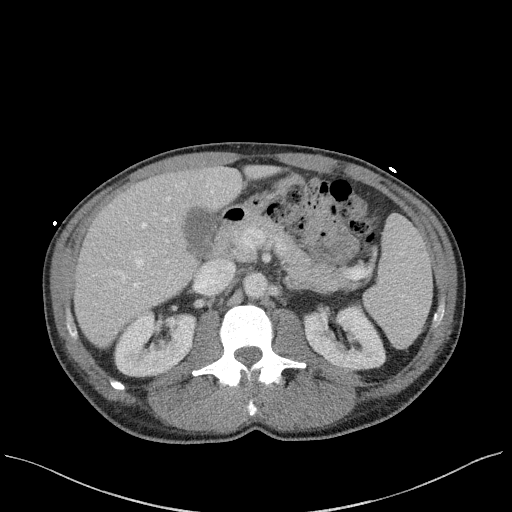
[im 64/83  soft-tissue]
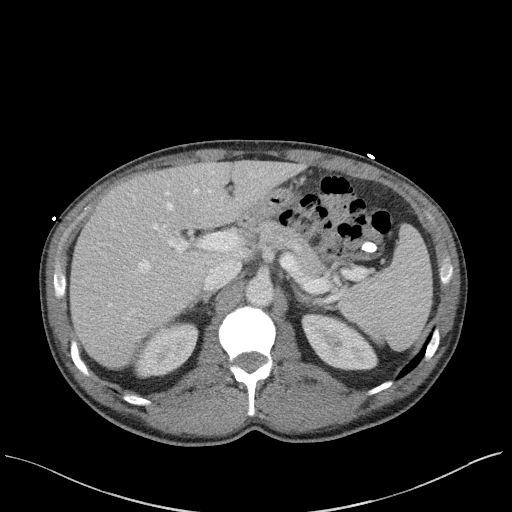
[im 73/83  soft-tissue]
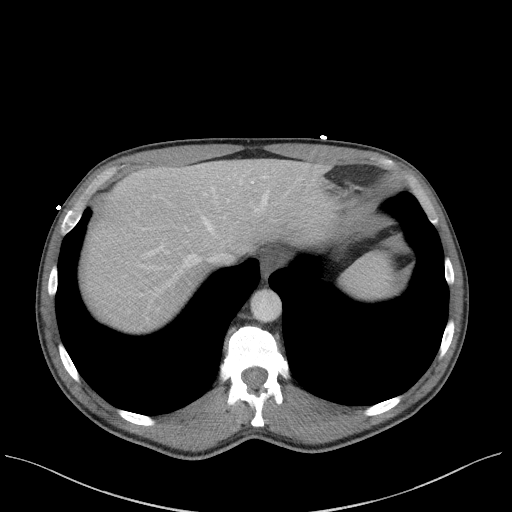
[im 78/83  soft-tissue]
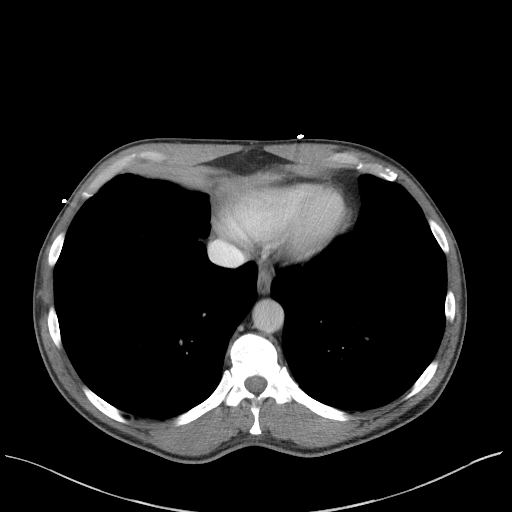

[Series 6: a/p w/ cor · coronal · 0.72mm/px · 3 of 132 slices shown]
[im 44/132  soft-tissue]
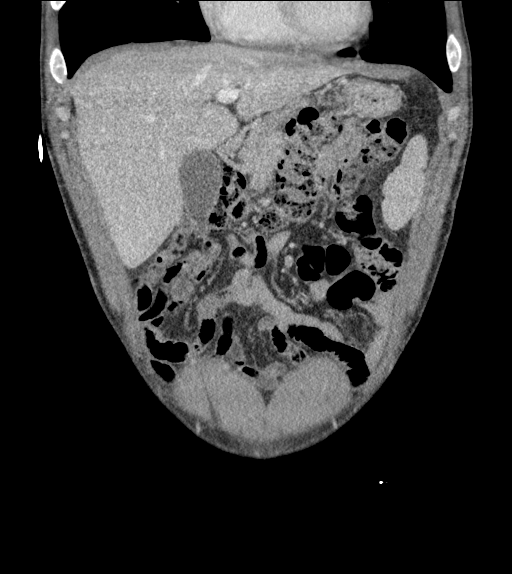
[im 59/132  soft-tissue]
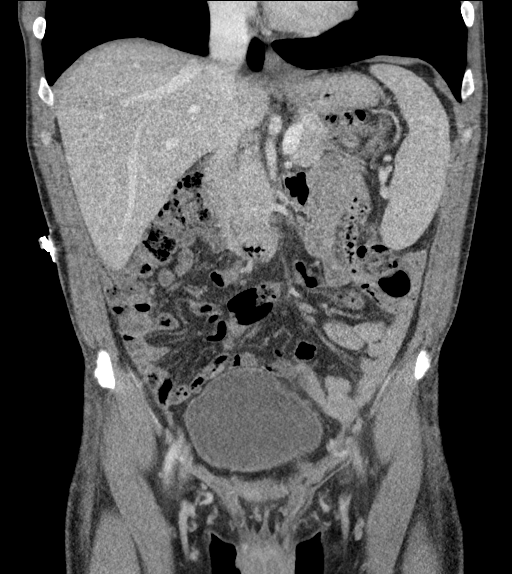
[im 73/132  soft-tissue]
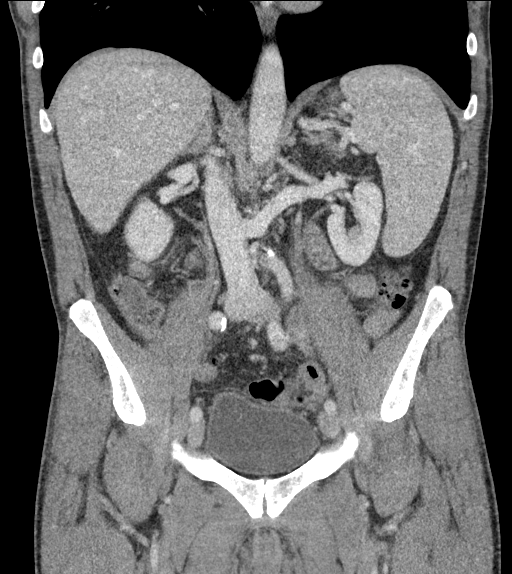

[16 of 46 positions shown; findings below may reference images not displayed]

FINDINGS: Lower chest: No acute finding of the lower chest

Hepatobiliary: Unremarkable liver.  Unremarkable gallbladder.

Pancreas: Unremarkable pancreas

Spleen: Cranial caudal span of the spleen measures greater than 14
cm.

Adrenals/Urinary Tract: Unremarkable appearance of the adrenal
glands. No evidence of hydronephrosis of the right or left kidney.
No nephrolithiasis. Unremarkable course of the bilateral ureters.
Unremarkable appearance of the urinary bladder.

Stomach/Bowel: Unremarkable stomach. Unremarkable small bowel. No
abnormal distention. No transition point. Normal appendix. Mild to
moderate stool burden.

Inflammatory changes of the sigmoid colon in a region of
diverticular change. Circumferential wall thickening. Edema of the
fat of the mesocolon. There is a small fluid and gas collection
measuring 2.4 cm. Thickening of the fascial planes.

Vascular/Lymphatic: Mild atherosclerotic changes of the abdominal
aorta. Bilateral iliac arteries and proximal femoral arteries are
patent.

No adenopathy.

Reproductive: Minimal calcifications of the prostate.

Other: None

Musculoskeletal: No acute displaced fracture. Bilateral L5 pars
defect with grade 1 anterolisthesis.
IMPRESSION: Acute sigmoid diverticulitis complicated by micro perforation and a
2.4 cm abscess.

Splenomegaly of uncertain etiology.

Aortic Atherosclerosis ([id number]).

Bilateral L5 pars defect with grade 1 anterolisthesis

## 2020-04-30 LAB — D-DIMER, QUANTITATIVE: D-Dimer, Quant: 0.31 mcg/mL FEU (ref <0.50)

## 2020-04-30 LAB — ALPHA-1 ANTITRYPSIN PHENOTYPE: A-1 Antitrypsin, Ser: 145 mg/dL (ref 83–199)

## 2020-05-01 ENCOUNTER — Encounter: Payer: Self-pay | Admitting: Internal Medicine

## 2020-05-01 NOTE — Progress Notes (Signed)
Letter mailed

## 2020-05-29 ENCOUNTER — Other Ambulatory Visit (HOSPITAL_COMMUNITY): Payer: 59

## 2020-05-31 ENCOUNTER — Other Ambulatory Visit: Payer: Self-pay | Admitting: Internal Medicine

## 2020-05-31 DIAGNOSIS — U099 Post covid-19 condition, unspecified: Secondary | ICD-10-CM

## 2020-05-31 DIAGNOSIS — R053 Chronic cough: Secondary | ICD-10-CM

## 2020-06-01 ENCOUNTER — Ambulatory Visit: Payer: 59 | Admitting: Internal Medicine

## 2020-06-01 ENCOUNTER — Other Ambulatory Visit (HOSPITAL_COMMUNITY)
Admission: RE | Admit: 2020-06-01 | Discharge: 2020-06-01 | Disposition: A | Discharge status: Home / Self Care | Payer: 59 | Source: Ambulatory Visit | Attending: Internal Medicine | Admitting: Internal Medicine

## 2020-06-01 DIAGNOSIS — Z20822 Contact with and (suspected) exposure to covid-19: Secondary | ICD-10-CM | POA: Diagnosis not present

## 2020-06-01 DIAGNOSIS — Z01812 Encounter for preprocedural laboratory examination: Secondary | ICD-10-CM | POA: Insufficient documentation

## 2020-06-01 NOTE — Progress Notes (Deleted)
Jerome Burke, male    DOB: [birth year]    MRN: [id number]   Brief patient profile:  44 yowm MM/quit smoking around 2012 but still vaping able to do steps all day at work then covid 09/2019 with aches, fever, cough s/p vax x 2 and no rx in [place] and w/in one week all better x R> L post cp just when wakes up better p a couple breaths does fine assoc with lingering cough ever since covid with minimal non-productive cough so referred to pulmonary clinic 04/17/2020 by Dr   Tiburcio Pea      History of Present Illness  04/17/2020  Pulmonary/ 1st office eval/Kaiven Vester  Chief Complaint  Patient presents with  . Pulmonary Consult    Referred by Joaquin Courts, NP.  Pt c/o "lung pain in the morning" since had covid 7 months ago. He states pain goes away when he takes a deep breath. He also has prod cough- minimal sputum and unsure of color.   Dyspnea: Not limited by breathing from desired activities   Cough: minimal dry daytime only  Sleep: fine  SABA use: none rec No medications at this point Please remember to go to the lab department   for your test >> nl Return here next avialable for PFTs and office visit to follow   06/01/2020  f/u ov/Yahsir Wickens re:  No chief complaint on file.   Dyspnea:  *** Cough: *** Sleeping: *** SABA use: *** 02: *** Covid status:   ***   No obvious day to day or daytime variability or assoc excess/ purulent sputum or mucus plugs or hemoptysis or cp or chest tightness, subjective wheeze or overt sinus or hb symptoms.   *** without nocturnal  or early am exacerbation  of respiratory  c/o's or need for noct saba. Also denies any obvious fluctuation of symptoms with weather or environmental changes or other aggravating or alleviating factors except as outlined above   No unusual exposure hx or h/o childhood pna/ asthma or knowledge of premature birth.  Current Allergies, Complete Past Medical History, Past Surgical History, Family History, and Social History were reviewed in  [employer] record.  ROS  The following are not active complaints unless bolded Hoarseness, sore throat, dysphagia, dental problems, itching, sneezing,  nasal congestion or discharge of excess mucus or purulent secretions, ear ache,   fever, chills, sweats, unintended wt loss or wt gain, classically pleuritic or exertional cp,  orthopnea pnd or arm/hand swelling  or leg swelling, presyncope, palpitations, abdominal pain, anorexia, nausea, vomiting, diarrhea  or change in bowel habits or change in bladder habits, change in stools or change in urine, dysuria, hematuria,  rash, arthralgias, visual complaints, headache, numbness, weakness or ataxia or problems with walking or coordination,  change in mood or  memory.        No outpatient medications have been marked as taking for the 06/01/20 encounter (Appointment) with Nyoka Cowden, MD.               Past Medical History:  Diagnosis Date  . HSV-1 (herpes simplex virus 1) infection         Objective:     Wt Readings from Last 3 Encounters:  04/17/20 176 lb (79.8 kg)      Vital signs reviewed  06/01/2020  - Note at rest 02 sats  ***% on ***   General appearance:    ***       Min ba***  Assessment

## 2020-06-02 LAB — SARS CORONAVIRUS 2 (TAT 6-24 HRS): SARS Coronavirus 2: NEGATIVE

## 2020-06-04 ENCOUNTER — Other Ambulatory Visit: Payer: Self-pay

## 2020-06-04 ENCOUNTER — Encounter: Payer: Self-pay | Admitting: Primary Care

## 2020-06-04 ENCOUNTER — Ambulatory Visit (INDEPENDENT_AMBULATORY_CARE_PROVIDER_SITE_OTHER): Payer: 59 | Admitting: Primary Care

## 2020-06-04 ENCOUNTER — Ambulatory Visit (INDEPENDENT_AMBULATORY_CARE_PROVIDER_SITE_OTHER): Payer: 59 | Admitting: Internal Medicine

## 2020-06-04 VITALS — BP 114/76 | HR 64 | Temp 98.1°F | Ht 70.0 in | Wt 174.0 lb

## 2020-06-04 DIAGNOSIS — Z87891 Personal history of nicotine dependence: Secondary | ICD-10-CM | POA: Diagnosis not present

## 2020-06-04 DIAGNOSIS — R053 Chronic cough: Secondary | ICD-10-CM | POA: Diagnosis not present

## 2020-06-04 DIAGNOSIS — U099 Post covid-19 condition, unspecified: Secondary | ICD-10-CM

## 2020-06-04 LAB — PULMONARY FUNCTION TEST
DL/VA % pred: 93 %
DL/VA: 4.02 ml/min/mmHg/L
DLCO cor % pred: 111 %
DLCO cor: 31.38 ml/min/mmHg
DLCO unc % pred: 111 %
DLCO unc: 31.38 ml/min/mmHg
FEF 25-75 Post: 3.18 L/sec
FEF 25-75 Pre: 2.44 L/sec
FEF2575-%Change-Post: 30 %
FEF2575-%Pred-Post: 101 %
FEF2575-%Pred-Pre: 77 %
FEV1-%Change-Post: 8 %
FEV1-%Pred-Post: 108 %
FEV1-%Pred-Pre: 99 %
FEV1-Post: 4.01 L
FEV1-Pre: 3.71 L
FEV1FVC-%Change-Post: 3 %
FEV1FVC-%Pred-Pre: 91 %
FEV6-%Change-Post: 5 %
FEV6-%Pred-Post: 118 %
FEV6-%Pred-Pre: 112 %
FEV6-Post: 5.5 L
FEV6-Pre: 5.23 L
FEV6FVC-%Change-Post: 0 %
FEV6FVC-%Pred-Post: 103 %
FEV6FVC-%Pred-Pre: 103 %
FVC-%Change-Post: 5 %
FVC-%Pred-Post: 114 %
FVC-%Pred-Pre: 108 %
FVC-Post: 5.56 L
FVC-Pre: 5.29 L
Post FEV1/FVC ratio: 72 %
Post FEV6/FVC ratio: 99 %
Pre FEV1/FVC ratio: 70 %
Pre FEV6/FVC Ratio: 100 %
RV % pred: 122 %
RV: 2.63 L
TLC % pred: 116 %
TLC: 8.09 L

## 2020-06-04 NOTE — Progress Notes (Signed)
@Patient  ID: , male    DOB: 31-Oct-1965, 55 y.o.   MRN: [id number]  Chief Complaint  Patient presents with  . Follow-up    No complaints    Referring provider: No ref. provider found    Brief patient profile:  55 yowm quit smoking around 2012 but still vaping able to do steps all day at work then covid 09/2019 with aches, fever, cough s/p vax x 2 and no rx in [place] and w/in one week all better x R> L post cp just when wakes up better p a couple breaths does fine assoc with lingering cough ever since covid with minimal non-productive cough so referred to pulmonary clinic 04/17/2020 by Dr. 04/19/2020   HPI: 55 year old male, former smoker. PMH significant for post covid chronic cough. Patient of Dr. 59, seen on 04/17/20 for initial consult. Ordered for PFTs.   06/04/2020- Interim hx  Patient presents tofay for 6 week follow-up with pulmonary function testing. He is doing well today. He no longer is experiencing chest tightness/pain to the degree he was previously. He has no other significant respiratory symptoms or cough. He is currently getting over a sinus infection. He is just getting to the point where is lungs are clearing up. He quit vaping. Denies chest tightness, wheezing, cough or shortness of breath.   Pulmonary function testing 06/04/2020 - FVC 5.56 (114%), FEV1 4.01 (108%), ratio 72, TLC 116%, DLCOunc 31.38 (111%) Normal spirometry. No BD response. Normal diffusion capacity. Slight curvature to flow vol loop    No Known Allergies  Immunization History  Administered Date(s) Administered  . PFIZER(Purple Top)SARS-COV-2 Vaccination 04/06/2019, 04/28/2019    Past Medical History:  Diagnosis Date  . HSV-1 (herpes simplex virus 1) infection     Tobacco History: Social History   Tobacco Use  Smoking Status Former Smoker  . Packs/day: 0.50  . Years: 30.00  . Pack years: 15.00  . Types: E-cigarettes, Cigarettes  . Quit date: 2016  . Years since quitting: 6.3   Smokeless Tobacco Never Used  Tobacco Comment   quit smoking and vaping   Counseling given: Not Answered Comment: quit smoking and vaping   Outpatient Medications Prior to Visit  Medication Sig Dispense Refill  . diphenhydrAMINE (BENADRYL) 25 MG tablet Take 50 mg by mouth every 6 (six) hours as needed for itching.  (Patient not taking: Reported on 06/04/2020)    . triamcinolone (KENALOG) 0.025 % ointment Apply 1 application topically 2 (two) times daily. (Patient not taking: Reported on 06/04/2020) 30 g 0   No facility-administered medications prior to visit.   Review of Systems  Review of Systems  Constitutional: Negative.   HENT: Negative.   Respiratory: Negative for cough, chest tightness, shortness of breath and wheezing.   Cardiovascular: Negative.     Physical Exam  BP 114/76 (BP Location: Left Arm, Cuff Size: Normal)   Pulse 64   Temp 98.1 F (36.7 C)   Ht 5\' 10"  (1.778 m)   Wt 174 lb (78.9 kg)   SpO2 97%   BMI 24.97 kg/m  Physical Exam Constitutional:      Appearance: Normal appearance.  HENT:     Head: Normocephalic and atraumatic.     Mouth/Throat:     Mouth: Mucous membranes are moist.     Pharynx: Oropharynx is clear.  Cardiovascular:     Rate and Rhythm: Normal rate and regular rhythm.  Pulmonary:     Effort: Pulmonary effort is normal.  Breath sounds: Normal breath sounds. No wheezing, rhonchi or rales.  Musculoskeletal:     Comments: Contracture to palm right hand   Skin:    General: Skin is warm and dry.  Neurological:     General: No focal deficit present.     Mental Status: He is alert and oriented to person, place, and time. Mental status is at baseline.  Psychiatric:        Mood and Affect: Mood normal.        Behavior: Behavior normal.        Thought Content: Thought content normal.        Judgment: Judgment normal.      Lab Results:  CBC    Component Value Date/Time   WBC 3.9 (L) 04/17/2020 1041   RBC 4.82 04/17/2020 1041    HGB 15.2 04/17/2020 1041   HCT 44.0 04/17/2020 1041   PLT 185.0 04/17/2020 1041   MCV 91.4 04/17/2020 1041   MCH 31.1 06/03/2018 0319   MCHC 34.5 04/17/2020 1041   RDW 13.0 04/17/2020 1041   LYMPHSABS 1.7 04/17/2020 1041   MONOABS 0.3 04/17/2020 1041   EOSABS 0.0 04/17/2020 1041   BASOSABS 0.0 04/17/2020 1041    BMET    Component Value Date/Time   NA 138 06/03/2018 0319   K 4.2 06/03/2018 0319   CL 105 06/03/2018 0319   CO2 23 06/03/2018 0319   GLUCOSE 75 06/03/2018 0319   BUN 14 06/03/2018 0319   CREATININE 1.04 06/03/2018 0319   CALCIUM 8.5 (L) 06/03/2018 0319   GFRNONAA >60 06/03/2018 0319   GFRAA >60 06/03/2018 0319    BNP No results found for: BNP  ProBNP No results found for: PROBNP  Imaging: No results found.   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 Pulmonary function testing today looked normal; FEV1 4.01 (108%), ratio 72. He did not meet ATS criteria for COPD. He did have slight curvature to flow volume loop and chest imaging showed hyperinflation. 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 If he developed respiratory symptoms he could trial Spiriva respimat 2.65mcg two puffs once daily.   Recommendations: - Stay as active as possible - Continue to abstain from smoking - If you develop bronchitis symptoms or shortness of breath/chest tightness call PCP or our office for office visit   Follow-up: - 1 year with Dr. Sherene Sires or sooner if needed       Glenford Bayley, NP 06/04/2020

## 2020-06-04 NOTE — Assessment & Plan Note (Signed)
No current symptoms. Pulmonary function testing today looked normal; FEV1 4.01 (108%), ratio 72. He did not meet ATS criteria for COPD. He did have slight curvature to flow volume loop and chest imaging showed hyperinflation. 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 If he developed respiratory symptoms he could trial Spiriva respimat 2.64mcg two puffs once daily.   Recommendations: - Stay as active as possible - Continue to abstain from smoking - If you develop bronchitis symptoms or shortness of breath/chest tightness call PCP or our office for office visit   Follow-up: - 1 year with Dr. Sherene Sires or sooner if needed

## 2020-06-04 NOTE — Patient Instructions (Signed)
Pulmonary function testing looked normal, you did not meet criteria for COPD or asthma Would potentially say you have COPD GOLD 0   Recommendations: - Stay as active as possible - Continue to abstain from smoking - If you develop bronchitis symptoms or shortness of breath/chest tightness call PCP or our office for office visit   Follow-up: - 1 year with Dr. Sherene Sires or sooner if needed

## 2020-06-04 NOTE — Progress Notes (Signed)
PFT done today. 

## 2021-02-21 ENCOUNTER — Emergency Department (INDEPENDENT_AMBULATORY_CARE_PROVIDER_SITE_OTHER)
Admission: RE | Admit: 2021-02-21 | Discharge: 2021-02-21 | Disposition: A | Discharge status: Home / Self Care | Payer: 59 | Source: Ambulatory Visit

## 2021-02-21 ENCOUNTER — Other Ambulatory Visit: Payer: Self-pay

## 2021-02-21 VITALS — BP 132/84 | HR 102 | Temp 98.9°F | Resp 20 | Ht 69.5 in | Wt 173.0 lb

## 2021-02-21 DIAGNOSIS — K5732 Diverticulitis of large intestine without perforation or abscess without bleeding: Secondary | ICD-10-CM

## 2021-02-21 DIAGNOSIS — R21 Rash and other nonspecific skin eruption: Secondary | ICD-10-CM

## 2021-02-21 HISTORY — DX: Diverticulitis of intestine, part unspecified, without perforation or abscess without bleeding: K57.92

## 2021-02-21 MED ORDER — CIPROFLOXACIN HCL 500 MG PO TABS: 500.0000 mg | ORAL_TABLET | Freq: Two times a day (BID) | ORAL | 0 refills | Status: AC

## 2021-02-21 MED ORDER — METRONIDAZOLE 500 MG PO TABS: 500.0000 mg | ORAL_TABLET | Freq: Three times a day (TID) | ORAL | 0 refills | Status: AC

## 2021-02-21 MED ORDER — METHYLPREDNISOLONE 4 MG PO TBPK: ORAL_TABLET | ORAL | 0 refills | Status: DC

## 2021-02-21 NOTE — ED Provider Notes (Signed)
[facility]    CSN: [id number] Arrival date & time: 02/21/21  1500      History   Chief Complaint Chief Complaint  Patient presents with   Abdominal Pain    HPI Jerome Burke is a 56 y.o. male.   HPI 56 year old male presents with left lower quadrant abdominal pain and cramping, including gas for 1 day.  Patient reports that he has had 3 episodes of diverticulitis in the past and this seems very similar.  Also pruritic erythematous rash of abdomen/torso which is typical when he gets diverticulitis.   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 Authorizing Provider  ciprofloxacin (CIPRO) 500 MG tablet Take 1 tablet (500 mg total) by mouth 2 (two) times daily for 10 days. 02/21/21 03/03/21 Yes Eliezer Lofts, FNP  methylPREDNISolone (MEDROL DOSEPAK) 4 MG TBPK tablet Take as directed 02/21/21  Yes Eliezer Lofts, FNP  metroNIDAZOLE (FLAGYL) 500 MG tablet Take 1 tablet (500 mg total) by mouth 3 (three) times daily for 10 days. 02/21/21 03/03/21 Yes Eliezer Lofts, FNP  diphenhydrAMINE (BENADRYL) 25 MG tablet Take 50 mg by mouth every 6 (six) hours as needed for itching.    [provider]  triamcinolone (KENALOG) 0.025 % ointment Apply 1 application topically 2 (two) times daily. Patient not taking: Reported on 06/04/2020 03/20/20   Scot Jun, FNP    Family History Family History  Problem Relation Age of  Onset   Hypertension Mother    Hypertension Father    Dementia Father     Social History Social History   Tobacco Use   Smoking status: Former    Packs/day: 0.50    Years: 30.00    Pack years: 15.00    Types: E-cigarettes, Cigarettes    Quit date: 2016    Years since quitting: 7.0   Smokeless tobacco: Never   Tobacco comments:    quit smoking and vaping  Vaping Use   Vaping Use: Former   Start date: 01/20/2014   Quit date: 03/06/2020  Substance Use Topics   Alcohol use: Yes    Comment: occ   Drug use: Yes    Types: Marijuana    Comment: occasionally--"edibles"     Allergies   Bactrim [sulfamethoxazole-trimethoprim]   Review of Systems Review of Systems  Gastrointestinal:  Positive for abdominal pain.  Skin:  Positive for rash.  All other systems reviewed and are negative.   Physical Exam Triage Vital Signs ED Triage Vitals  Enc Vitals Group     BP 02/21/21 1525 132/84     Pulse Rate 02/21/21 1525 (!) 102     Resp 02/21/21 1525 20     Temp 02/21/21 1525 98.9 F (37.2 C)     Temp Source 02/21/21 1525 Oral     SpO2 02/21/21 1525 98 %     Weight 02/21/21 1519  173 lb (78.5 kg)     Height 02/21/21 1519 5' 9.5" (1.765 m)     Head Circumference --      Peak Flow --      Pain Score 02/21/21 1518 2     Pain Loc --      Pain Edu? --      Excl. in [place]? --    No data found.  Updated Vital Signs BP 132/84 (BP Location: Right Arm)    Pulse (!) 102    Temp 98.9 F (37.2 C) (Oral)    Resp 20    Ht 5' 9.5" (1.765 m)    Wt 173 lb (78.5 kg)    SpO2 98%    BMI 25.18 kg/m      Physical Exam Vitals and nursing note reviewed.  Constitutional:      Appearance: He is well-developed and normal weight.  HENT:     Head: Normocephalic and atraumatic.     Mouth/Throat:     Mouth: Mucous membranes are moist.     Pharynx: Oropharynx is clear.  Eyes:     Extraocular Movements: Extraocular movements intact.     Pupils: Pupils are equal, round, and reactive to light.   Cardiovascular:     Rate and Rhythm: Normal rate and regular rhythm.     Heart sounds: Normal heart sounds. No murmur heard.   No friction rub. No gallop.  Pulmonary:     Effort: Pulmonary effort is normal.     Breath sounds: Normal breath sounds.  Abdominal:     General: Abdomen is flat. Bowel sounds are absent.     Palpations: There is no hepatomegaly or splenomegaly.     Tenderness: There is abdominal tenderness in the left upper quadrant and left lower quadrant. There is no right CVA tenderness, left CVA tenderness, guarding or rebound. Negative signs include Murphy's sign and McBurney's sign.     Hernia: No hernia is present.  Skin:    General: Skin is warm and dry.     Comments: Abdomen/torso: Pruritic erythematous maculopapular eruption noted  Neurological:     General: No focal deficit present.     Mental Status: He is alert and oriented to person, place, and time.     UC Treatments / Results  Labs (all labs ordered are listed, but only abnormal results are displayed) Labs Reviewed - No data to display  EKG   Radiology No results found.  Procedures Procedures (including critical care time)  Medications Ordered in UC Medications - No data to display  Initial Impression / Assessment and Plan / UC Course  I have reviewed the triage vital signs and the nursing notes.  Pertinent labs & imaging results that were available during my care of the patient were reviewed by me and considered in my medical decision making (see chart for details).     MDM: 1.  Diverticulitis of colon-Rx'd Cipro and Flagyl; 2.  Rash and nonspecific skin eruption-Rx'd Medrol Dosepak. Advised patient to take medication as directed with food to completion.  Encouraged patient to increase daily water intake while taking these medications.  Advised patient if symptoms worsen and/or unresolved please follow-up with PCP or here for further evaluation.  Patient discharged home, hemodynamically  stable. Final Clinical Impressions(s) / UC Diagnoses   Final diagnoses:  Diverticulitis of colon  Rash and nonspecific skin eruption     Discharge Instructions      Advised patient to take medication as directed with food to  completion.  Encouraged patient to increase daily water intake while taking these medications.  Advised patient if symptoms worsen and/or unresolved please follow-up with PCP or here for further evaluation.     ED Prescriptions     Medication Sig Dispense Auth. Provider   ciprofloxacin (CIPRO) 500 MG tablet Take 1 tablet (500 mg total) by mouth 2 (two) times daily for 10 days. 20 tablet Eliezer Lofts, FNP   metroNIDAZOLE (FLAGYL) 500 MG tablet Take 1 tablet (500 mg total) by mouth 3 (three) times daily for 10 days. 30 tablet Eliezer Lofts, FNP   methylPREDNISolone (MEDROL DOSEPAK) 4 MG TBPK tablet Take as directed 1 each Eliezer Lofts, FNP      PDMP not reviewed this encounter.   Gardy, Letizia, [address] 02/21/21 1625

## 2021-02-21 NOTE — ED Triage Notes (Signed)
Pt presents to Urgent Care with c/o LLQ pain w/ cramping and gas since yesterday. Reports that he has had diverticulitis 3 times in the past and this feels very similar. Also states he has a widespread rash, which is typical when he has diverticulitis.

## 2021-02-21 NOTE — Discharge Instructions (Addendum)
Advised patient to take medication as directed with food to completion.  Encouraged patient to increase daily water intake while taking these medications.  Advised patient if symptoms worsen and/or unresolved please follow-up with PCP or here for further evaluation.

## 2021-02-22 ENCOUNTER — Telehealth: Payer: Self-pay | Admitting: Emergency Medicine

## 2021-02-22 NOTE — Telephone Encounter (Signed)
Copy of Ragnar's insurance card faxed to [facility] . Copy given to Selena Batten, patient access

## 2021-04-23 ENCOUNTER — Emergency Department (INDEPENDENT_AMBULATORY_CARE_PROVIDER_SITE_OTHER)
Admission: RE | Admit: 2021-04-23 | Discharge: 2021-04-23 | Disposition: A | Discharge status: Home / Self Care | Payer: 59 | Source: Ambulatory Visit

## 2021-04-23 VITALS — BP 123/84 | HR 71 | Temp 98.5°F | Resp 17

## 2021-04-23 DIAGNOSIS — M5416 Radiculopathy, lumbar region: Secondary | ICD-10-CM

## 2021-04-23 MED ORDER — METHYLPREDNISOLONE 4 MG PO TBPK: ORAL_TABLET | ORAL | 0 refills | Status: AC

## 2021-04-23 MED ORDER — BACLOFEN 10 MG PO TABS: 10.0000 mg | ORAL_TABLET | Freq: Three times a day (TID) | ORAL | 0 refills | Status: AC

## 2021-04-23 MED ORDER — METHYLPREDNISOLONE SODIUM SUCC 125 MG IJ SOLR
125.0000 mg | Freq: Once | INTRAMUSCULAR | Status: AC
  Administered 2021-04-23: 125 mg via INTRAMUSCULAR

## 2021-04-23 NOTE — Discharge Instructions (Addendum)
Advised patient to take medication as directed with food to completion.  Instructed patient to start Medrol Dosepak tomorrow morning, Wednesday, 04/24/2021.  Advised patient may take Baclofen daily or as needed for accompanying muscle spasms. ?

## 2021-04-23 NOTE — ED Triage Notes (Signed)
Pt c/o lower back pain and LT hip pain x 3 days. Worse at night. Hx of sciatica. Tylenol and naproxen with no relief.  ?

## 2021-04-23 NOTE — ED Provider Notes (Signed)
?[place] ? ? ? ?CSN: [id number] ?Arrival date & time: 04/23/21  1348 ? ? ?  ? ?History   ?Chief Complaint ?Chief Complaint  ?Patient presents with  ? Back Pain  ?  Lower  ? ? ?HPI ?Roy Lundstrom is a 56 y.o. male.  ? ?HPI 57 year old male presents with lower back pain left hip pain for 3 days.  Worse at night.  Patient reports history of sciatica.  H significant for abscess of sigmoid colon due to diverticulitis and atypical chest pain. ? ?Past Medical History:  ?Diagnosis Date  ? Diverticulitis   ? HSV-1 (herpes simplex virus 1) infection   ? ? ?Patient Active Problem List  ? Diagnosis Date Noted  ? Atypical chest pain 04/17/2020  ? Post-COVID chronic cough 04/17/2020  ? 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 Abscess of sigmoid colon due to diverticulitis 06/02/2018  ? Rash and nonspecific skin eruption 06/02/2018  ? Jaw pain, non-TMJ 06/02/2018  ? ? ?Past Surgical History:  ?Procedure Laterality Date  ? VASECTOMY    ? WISDOM TOOTH EXTRACTION    ? ? ? ? ? ?Home Medications   ? ?Prior to Admission medications   ?Medication Sig Start Date End Date Taking? Authorizing Provider  ?baclofen (LIORESAL) 10 MG tablet Take 1 tablet (10 mg total) by mouth 3 (three) times daily. 04/23/21  Yes Eliezer Lofts, FNP  ?methylPREDNISolone (MEDROL DOSEPAK) 4 MG TBPK tablet Take as directed 04/23/21  Yes Eliezer Lofts, FNP  ?diphenhydrAMINE (BENADRYL) 25 MG tablet Take 50 mg by mouth every 6 (six) hours as needed for itching.    [provider]  ?triamcinolone (KENALOG) 0.025 % ointment Apply 1 application topically 2 (two) times daily. ?Patient not taking: Reported on 06/04/2020 03/20/20   Scot Jun, FNP  ? ? ?Family History ?Family History  ?Problem Relation Age of Onset  ? Hypertension Mother   ? Hypertension Father   ? Dementia Father   ? ? ?Social History ?Social History  ? ?Tobacco Use  ? Smoking status: Former  ?  Packs/day: 0.50  ?  Years: 30.00  ?  Pack years: 15.00  ?  Types: E-cigarettes, Cigarettes  ?   Quit date: 2016  ?  Years since quitting: 7.2  ? Smokeless tobacco: Never  ? Tobacco comments:  ?  quit smoking and vaping  ?Vaping Use  ? Vaping Use: Former  ? Start date: 01/20/2014  ? Quit date: 03/06/2020  ?Substance Use Topics  ? Alcohol use: Yes  ?  Comment: occ  ? Drug use: Yes  ?  Types: Marijuana  ?  Comment: occasionally--"edibles"  ? ? ? ?Allergies   ?Bactrim [sulfamethoxazole-trimethoprim] ? ? ?Review of Systems ?Review of Systems  ?Musculoskeletal:  Positive for back pain.  ?     Left hip pain and low back pain x3 days  ? ? ?Physical Exam ?Triage Vital Signs ?ED Triage Vitals  ?Enc Vitals Group  ?   BP 04/23/21 1358 123/84  ?   Pulse Rate 04/23/21 1358 71  ?   Resp 04/23/21 1358 17  ?   Temp 04/23/21 1358 98.5 ?F (36.9 ?C)  ?   Temp Source 04/23/21 1358 Oral  ?   SpO2 04/23/21 1358 98 %  ?   Weight --   ?   Height --   ?   Head Circumference --   ?   Peak Flow --   ?   Pain Score 04/23/21 1359 5  ?   Pain  Loc --   ?   Pain Edu? --   ?   Excl. in [place]? --   ? ?No data found. ? ?Updated Vital Signs ?BP 123/84 (BP Location: Right Arm)   Pulse 71   Temp 98.5 ?F (36.9 ?C) (Oral)   Resp 17   SpO2 98%  ? ? ?Physical Exam ?Vitals and nursing note reviewed.  ?Constitutional:   ?   General: He is not in acute distress. ?   Appearance: Normal appearance. He is normal weight. He is not ill-appearing.  ?HENT:  ?   Head: Normocephalic and atraumatic.  ?   Mouth/Throat:  ?   Mouth: Mucous membranes are moist.  ?   Pharynx: Oropharynx is clear.  ?Eyes:  ?   Extraocular Movements: Extraocular movements intact.  ?   Conjunctiva/sclera: Conjunctivae normal.  ?   Pupils: Pupils are equal, round, and reactive to light.  ?Cardiovascular:  ?   Rate and Rhythm: Normal rate and regular rhythm.  ?   Pulses: Normal pulses.  ?   Heart sounds: Normal heart sounds.  ?Pulmonary:  ?   Effort: Pulmonary effort is normal.  ?   Breath sounds: Normal breath sounds. No wheezing, rhonchi or rales.  ?Musculoskeletal:  ?   Cervical back:  Normal range of motion and neck supple.  ?   Comments: Lumbar sacral spine (left-sided inferior aspect): TTP over paraspinous muscles and inferior spinal erectors, with palpable muscle adhesions noted  ?Skin: ?   General: Skin is warm and dry.  ?Neurological:  ?   General: No focal deficit present.  ?   Mental Status: He is alert and oriented to person, place, and time. Mental status is at baseline.  ? ? ? ?UC Treatments / Results  ?Labs ?(all labs ordered are listed, but only abnormal results are displayed) ?Labs Reviewed - No data to display ? ?EKG ? ? ?Radiology ?No results found. ? ?Procedures ?Procedures (including critical care time) ? ?Medications Ordered in UC ?Medications  ?methylPREDNISolone sodium succinate (SOLU-MEDROL) 125 mg/2 mL injection 125 mg (125 mg Intramuscular Given 04/23/21 1500)  ? ? ?Initial Impression / Assessment and Plan / UC Course  ?I have reviewed the triage vital signs and the nursing notes. ? ?Pertinent labs & imaging results that were available during my care of the patient were reviewed by me and considered in my medical decision making (see chart for details). ? ?  ? ?MDM: 1.  Lumbar radiculopathy-left-sided-IM Solu-Medrol 125 mg given once in clinic prior to discharge Rx'd Medrol Dosepak and Baclofen. Advised patient to take medication as directed with food to completion.  Instructed patient to start Medrol Dosepak tomorrow morning, Wednesday, 04/24/2021.  Advised patient may take Baclofen daily or as needed for accompanying muscle spasms.  Patient discharged home, hemodynamically stable. ?Final Clinical Impressions(s) / UC Diagnoses  ? ?Final diagnoses:  ?Left lumbar radiculopathy  ? ? ? ?Discharge Instructions   ? ?  ?Advised patient to take medication as directed with food to completion.  Instructed patient to start Medrol Dosepak tomorrow morning, Wednesday, 04/24/2021.  Advised patient may take Baclofen daily or as needed for accompanying muscle spasms. ? ? ? ? ?ED Prescriptions    ? ? Medication Sig Dispense Auth. Provider  ? methylPREDNISolone (MEDROL DOSEPAK) 4 MG TBPK tablet Take as directed 1 each Eliezer Lofts, FNP  ? baclofen (LIORESAL) 10 MG tablet Take 1 tablet (10 mg total) by mouth 3 (three) times daily. 37 each Eliezer Lofts, FNP  ? ?  ? ?  PDMP not reviewed this encounter. ?  ?[place], [place], [place] ?04/23/21 1507 ? ?

## 2021-04-24 ENCOUNTER — Encounter: Payer: Self-pay | Admitting: Family Medicine

## 2021-04-24 ENCOUNTER — Ambulatory Visit: Payer: 59 | Admitting: Family Medicine

## 2021-04-24 VITALS — BP 120/82 | Ht 69.5 in | Wt 173.0 lb

## 2021-04-24 DIAGNOSIS — M4310 Spondylolisthesis, site unspecified: Secondary | ICD-10-CM | POA: Diagnosis not present

## 2021-04-24 DIAGNOSIS — M5416 Radiculopathy, lumbar region: Secondary | ICD-10-CM | POA: Diagnosis not present

## 2021-04-24 MED ORDER — HYDROCODONE-ACETAMINOPHEN 5-325 MG PO TABS
1.0000 | ORAL_TABLET | Freq: Three times a day (TID) | ORAL | 0 refills | Status: DC | PRN
Start: 2021-04-24 — End: 2021-05-07

## 2021-04-24 NOTE — Patient Instructions (Signed)
Nice to meet you ?Please try heat  ?Please try the exercises  ?Please use the pain medicine as needed  ?I will call with the xray results.   ?Please send me a message in MyChart with any questions or updates.  ?Please see me back in 2-3 weeks.  ? ?--Dr. Raeford Razor ? ?

## 2021-04-24 NOTE — Progress Notes (Signed)
?  [facility] - 56 y.o. male MRN [id number]  Date of birth: 01/08/1966 ? ?SUBJECTIVE:  Including CC & ROS.  ?No chief complaint on file. ? ? ?Jerome Burke is a 56 y.o. male that is presenting with acute on chronic radicular pain.  He has experience with previous pain in the past.  Has never been this long-lasting.  He has not been able to sleep the past 3 nights.  No improvement with steroids thus far.  No history of surgery. ? ?Review of the urgent care note from 4/4 shows he was provided Solu-Medrol injection.  He is also provided Medrol Dosepak and baclofen. ?Independent review of the CT abdomen pelvis from 2020 shows bilateral pars defect at L5 with grade 1 anterolisthesis at L5-S1. ? ?Review of Systems ?See HPI  ? ?HISTORY: Past Medical, Surgical, Social, and Family History Reviewed & Updated per EMR.   ?Pertinent Historical Findings include: ? ?Past Medical History:  ?Diagnosis Date  ? Diverticulitis   ? HSV-1 (herpes simplex virus 1) infection   ? ? ?Past Surgical History:  ?Procedure Laterality Date  ? VASECTOMY    ? WISDOM TOOTH EXTRACTION    ? ? ? ?PHYSICAL EXAM:  ?VS: BP 120/82 (BP Location: Left Arm, Patient Position: Sitting)   Ht 5' 9.5" (1.765 m)   Wt 173 lb (78.5 kg)   BMI 25.18 kg/m?  ?Physical Exam ?Gen: NAD, alert, cooperative with exam, well-appearing ?MSK:  ?Neurovascularly intact   ? ? ? ? ?ASSESSMENT & PLAN:  ? ?Pars defect with spondylolisthesis ?Acute on chronic in nature.  Does have defect from previous CT scan from 2020. ?-Counseled on home exercise therapy and supportive care. ?-X-ray. ?-May consider further imaging. ? ?Lumbar radiculopathy ?Acute on chronic in nature.  Symptoms most consistent with radicular pain. ?-Counseled on home exercise therapy and supportive care. ?-Norco. ?-Could consider physical therapy. ? ? ? ? ?

## 2021-04-24 NOTE — Assessment & Plan Note (Signed)
Acute on chronic in nature.  Does have defect from previous CT scan from 2020. ?-Counseled on home exercise therapy and supportive care. ?-X-ray. ?-May consider further imaging. ?

## 2021-04-24 NOTE — Assessment & Plan Note (Signed)
Acute on chronic in nature.  Symptoms most consistent with radicular pain. ?-Counseled on home exercise therapy and supportive care. ?-Norco. ?-Could consider physical therapy. ?

## 2021-04-25 ENCOUNTER — Ambulatory Visit (HOSPITAL_BASED_OUTPATIENT_CLINIC_OR_DEPARTMENT_OTHER)
Admission: RE | Admit: 2021-04-25 | Discharge: 2021-04-25 | Disposition: A | Discharge status: Home / Self Care | Payer: 59 | Source: Ambulatory Visit | Attending: Family Medicine | Admitting: Family Medicine

## 2021-04-25 DIAGNOSIS — M4310 Spondylolisthesis, site unspecified: Secondary | ICD-10-CM | POA: Insufficient documentation

## 2021-04-25 DIAGNOSIS — M5416 Radiculopathy, lumbar region: Secondary | ICD-10-CM | POA: Insufficient documentation

## 2021-04-29 ENCOUNTER — Telehealth: Payer: Self-pay | Admitting: Family Medicine

## 2021-04-29 MED ORDER — MELOXICAM 15 MG PO TABS: 15.0000 mg | ORAL_TABLET | Freq: Every day | ORAL | 1 refills | Status: AC

## 2021-04-29 MED ORDER — GABAPENTIN 300 MG PO CAPS: 300.0000 mg | ORAL_CAPSULE | Freq: Three times a day (TID) | ORAL | 1 refills | Status: AC

## 2021-04-29 NOTE — Telephone Encounter (Signed)
Pt called request provider call him as soon as possible -- Back/Hip pain not better-- ?[phone] ?-glh ?

## 2021-04-29 NOTE — Telephone Encounter (Signed)
Patient is still having severe ongoing radicular pain.  We will send in gabapentin and meloxicam.  If pain is still ongoing may need to consider SI joint injection or further imaging. ? ?Rosemarie Ax, MD ?[facility] ?04/29/2021, 9:58 AM ? ?

## 2021-05-02 ENCOUNTER — Other Ambulatory Visit: Payer: Self-pay | Admitting: *Deleted

## 2021-05-02 ENCOUNTER — Other Ambulatory Visit: Payer: Self-pay | Admitting: Family Medicine

## 2021-05-02 DIAGNOSIS — M5416 Radiculopathy, lumbar region: Secondary | ICD-10-CM

## 2021-05-02 DIAGNOSIS — R1084 Generalized abdominal pain: Secondary | ICD-10-CM

## 2021-05-03 ENCOUNTER — Other Ambulatory Visit: Payer: Self-pay | Admitting: *Deleted

## 2021-05-03 DIAGNOSIS — M5416 Radiculopathy, lumbar region: Secondary | ICD-10-CM

## 2021-05-06 ENCOUNTER — Encounter: Payer: Self-pay | Admitting: Family Medicine

## 2021-05-06 ENCOUNTER — Telehealth: Payer: Self-pay | Admitting: Family Medicine

## 2021-05-06 ENCOUNTER — Other Ambulatory Visit: Payer: Self-pay | Admitting: *Deleted

## 2021-05-06 DIAGNOSIS — M5416 Radiculopathy, lumbar region: Secondary | ICD-10-CM

## 2021-05-06 NOTE — Addendum Note (Signed)
Addended by: Cyd Silence on: 05/06/2021 01:32 PM ? ? Modules accepted: Orders ? ?

## 2021-05-06 NOTE — Telephone Encounter (Signed)
Pt clld because [facility] contact him to schedule MRI & he only knew about CT Scan (advised of reason for MRI)-- patient then ask if CT Scan is to be cancelled & rescheduled for a later date. ? ?--Advised pt a Precert for the MRI has to be obtained before any scheduling or risk of [employer] not covering & that [facility]/  should be contacted re: R/S of CT Scan due to the changes. ? ?--Patient also request a stronger Pain Rx if provider recommends because neither 5mg  Hydrocodone nor meloxicam are helping with great pain. ? ?--Forwarding request to med asst for review w/provider. ?--glh ?

## 2021-05-06 NOTE — Addendum Note (Signed)
Addended by: Annita Brod on: 05/06/2021 01:29 PM ? ? Modules accepted: Orders ? ?

## 2021-05-07 ENCOUNTER — Encounter: Payer: Self-pay | Admitting: Family Medicine

## 2021-05-07 ENCOUNTER — Telehealth: Payer: Self-pay | Admitting: Family Medicine

## 2021-05-07 ENCOUNTER — Other Ambulatory Visit: Payer: Self-pay | Admitting: Family Medicine

## 2021-05-07 MED ORDER — OXYCODONE-ACETAMINOPHEN 10-325 MG PO TABS: 1.0000 | ORAL_TABLET | Freq: Three times a day (TID) | ORAL | 0 refills | Status: DC | PRN

## 2021-05-07 MED ORDER — OXYCODONE-ACETAMINOPHEN 7.5-325 MG PO TABS: 1.0000 | ORAL_TABLET | Freq: Three times a day (TID) | ORAL | 0 refills | Status: DC | PRN

## 2021-05-07 NOTE — Progress Notes (Signed)
Provided percocet for pain.  ? ?Myra Rude, MD ?[facility] ?05/07/2021, 7:49 AM ? ?

## 2021-05-07 NOTE — Telephone Encounter (Signed)
Pt informed of below.  

## 2021-05-07 NOTE — Progress Notes (Signed)
Provided percocet 10 mg.  ? ?Myra Rude, MD ?[facility] ?05/07/2021, 6:04 PM ? ?

## 2021-05-07 NOTE — Telephone Encounter (Signed)
Per patient Pharmacy could not fill Rx Percocet Rx they do not have : ?oxyCODONE-acetaminophen (PERCOCET) 7.5-325 MG tablet [[id number]]  ?  Order Details ?Dose: 1 tablet Route: Oral Frequency: Every 8 hours PRN  ?Dispense Quantity: 15 tablet Refills: 0   ?     ?Sig: Take 1 tablet by mouth every 8 (eight) hours as needed  ? ?( Not at [facility] nor [place]) ? ?Pt says [facility] does have the 10mg  Percocet available but says Doctor must approve & send  ?Rx. ? ?--Forwarding request to med asst for review w/ provider. ? ?-glh ? ?

## 2021-05-07 NOTE — Telephone Encounter (Signed)
Provided different dose of percocet.  ? ?Rosemarie Ax, MD ?[facility] ?05/07/2021, 6:12 PM ? ?

## 2021-05-08 ENCOUNTER — Other Ambulatory Visit: Payer: 59

## 2021-05-09 ENCOUNTER — Ambulatory Visit: Payer: 59 | Admitting: Family Medicine

## 2021-05-15 ENCOUNTER — Other Ambulatory Visit: Payer: Self-pay | Admitting: Family Medicine

## 2021-05-16 MED ORDER — OXYCODONE-ACETAMINOPHEN 10-325 MG PO TABS: 1.0000 | ORAL_TABLET | Freq: Three times a day (TID) | ORAL | 0 refills | Status: AC | PRN

## 2021-06-04 ENCOUNTER — Ambulatory Visit: Payer: 59 | Admitting: Internal Medicine

## 2022-05-05 ENCOUNTER — Encounter: Payer: Self-pay | Admitting: *Deleted
